# Patient Record
Sex: Male | Born: 1950 | Race: White | Hispanic: No | Marital: Married | Smoking: Never smoker
Health system: Southern US, Community
[De-identification: ages and names within clinical notes are randomized; demographics above are authoritative.]

## PROBLEM LIST (undated history)

## (undated) DIAGNOSIS — I259 Chronic ischemic heart disease, unspecified: Secondary | ICD-10-CM

## (undated) DIAGNOSIS — R0602 Shortness of breath: Secondary | ICD-10-CM

## (undated) DIAGNOSIS — I2510 Atherosclerotic heart disease of native coronary artery without angina pectoris: Secondary | ICD-10-CM

## (undated) DIAGNOSIS — E785 Hyperlipidemia, unspecified: Secondary | ICD-10-CM

## (undated) DIAGNOSIS — I10 Essential (primary) hypertension: Secondary | ICD-10-CM

## (undated) HISTORY — DX: Hyperlipidemia, unspecified: E78.5

## (undated) HISTORY — DX: Chronic ischemic heart disease, unspecified: I25.9

## (undated) HISTORY — PX: EYE SURGERY: SHX253

## (undated) HISTORY — DX: Shortness of breath: R06.02

## (undated) HISTORY — DX: Atherosclerotic heart disease of native coronary artery without angina pectoris: I25.10

---

## 2002-08-03 ENCOUNTER — Ambulatory Visit (HOSPITAL_COMMUNITY): Admission: RE | Admit: 2002-08-03 | Discharge: 2002-08-03 | Payer: Self-pay | Admitting: Cardiovascular Disease

## 2002-08-03 HISTORY — PX: CARDIAC CATHETERIZATION: SHX172

## 2004-12-17 ENCOUNTER — Ambulatory Visit (HOSPITAL_COMMUNITY): Admission: RE | Admit: 2004-12-17 | Discharge: 2004-12-17 | Payer: Self-pay | Admitting: Endocrinology

## 2005-12-08 ENCOUNTER — Emergency Department (HOSPITAL_COMMUNITY): Admission: EM | Admit: 2005-12-08 | Discharge: 2005-12-08 | Payer: Self-pay | Admitting: Emergency Medicine

## 2007-10-10 ENCOUNTER — Encounter: Admission: RE | Admit: 2007-10-10 | Discharge: 2007-10-10 | Payer: Self-pay | Admitting: Orthopedic Surgery

## 2007-10-18 ENCOUNTER — Encounter: Admission: RE | Admit: 2007-10-18 | Discharge: 2008-01-16 | Payer: Self-pay | Admitting: Orthopedic Surgery

## 2009-07-11 DIAGNOSIS — M4310 Spondylolisthesis, site unspecified: Secondary | ICD-10-CM | POA: Insufficient documentation

## 2010-05-12 ENCOUNTER — Ambulatory Visit: Payer: Self-pay | Admitting: Cardiology

## 2010-06-10 ENCOUNTER — Telehealth (INDEPENDENT_AMBULATORY_CARE_PROVIDER_SITE_OTHER): Payer: Self-pay | Admitting: *Deleted

## 2010-06-11 ENCOUNTER — Encounter: Payer: Self-pay | Admitting: Cardiology

## 2010-06-11 ENCOUNTER — Encounter (HOSPITAL_COMMUNITY)
Admission: RE | Admit: 2010-06-11 | Discharge: 2010-08-05 | Payer: Self-pay | Source: Home / Self Care | Attending: Cardiology | Admitting: Cardiology

## 2010-06-11 ENCOUNTER — Encounter: Payer: Self-pay | Admitting: *Deleted

## 2010-06-11 ENCOUNTER — Ambulatory Visit: Payer: Self-pay

## 2010-08-05 NOTE — Progress Notes (Signed)
Summary: Nuclear pre procedure  Phone Note Outgoing Call Call back at [facility] Phone [phone]   Call placed by: Rea College, CMA,  June 10, 2010 4:25 PM Call placed to: Patient Summary of Call: Reviewed information on Myoview Information Sheet (see scanned document for further details).  Spoke with patient.      Nuclear Med Background Indications for Stress Test: Evaluation for Ischemia   History: Heart Catheterization, Myocardial Perfusion Study   Symptoms: Chest Tightness with Exertion, DOE, Fatigue with Exertion    Nuclear Pre-Procedure Cardiac Risk Factors: Hypertension, Lipids

## 2010-08-05 NOTE — Assessment & Plan Note (Signed)
Summary: Cardiology Nuclear Testing  Nuclear Med Background Indications for Stress Test: Evaluation for Ischemia   History: Heart Catheterization, Myocardial Perfusion Study  History Comments: '04 Cath:single vessel occlusion OM1, NL LVF,Tx Med. 8/10 MPS: Abnormal reversible lateral ischemia with known chronic occlusion OM1, unchanged from previous study.  Symptoms: Chest Tightness with Exertion, Dizziness, DOE, Fatigue with Exertion, Palpitations    Nuclear Pre-Procedure Cardiac Risk Factors: Hypertension, Lipids Caffeine/Decaff Intake: None NPO After: 10:00 PM Lungs: clear IV 0.9% NS with Angio Cath: 22g     IV Site: R Hand IV Started by: Irean Hong, RN Chest Size (in) 44     Height (in): 66.5 Weight (lb): 179 BMI: 28.56 Tech Comments: Held toprol 24 hrs.  Nuclear Med Study 1 or 2 day study:  1 day     Stress Test Type:  Stress Reading MD:  Cassell Clement, MD     Referring MD:  Wylene Simmer Resting Radionuclide:  Technetium 83m Tetrofosmin     Resting Radionuclide Dose:  11.0 mCi  Stress Radionuclide:  Technetium 5m Tetrofosmin     Stress Radionuclide Dose:  33.0 mCi   Stress Protocol Exercise Time (min):  10:00 min     Max HR:  142 bpm     Predicted Max HR:  163 bpm  Max Systolic BP: 195 mm Hg     Percent Max HR:  87.12 %     METS: 11.70 Rate Pressure Product:  95188    Stress Test Technologist:  Milana Na, EMT-P     Nuclear Technologist:  Domenic Polite, CNMT  Rest Procedure  Myocardial perfusion imaging was performed at rest 45 minutes following the intravenous administration of Technetium 79m Tetrofosmin.  Stress Procedure  The patient exercised for 10:00. The patient stopped due to fatigue, sob, and chest tightness.  There were + significant ST-T wave changes and occ pacs.  Technetium 47m Tetrofosmin was injected at peak exercise and myocardial perfusion imaging was performed after a brief delay.  QPS Raw Data Images:  Normal; no motion artifact;  normal heart/lung ratio. Stress Images:  There is decreased uptake in the lateral wall. Rest Images:  Normal homogeneous uptake in all areas of the myocardium. Subtraction (SDS):  Reversible lateral wall ischemia. Transient Ischemic Dilatation:  1.09  (Normal <1.22)  Lung/Heart Ratio:  .29  (Normal <0.45)  Quantitative Gated Spect Images QGS EDV:  78 ml QGS ESV:  27 ml QGS EF:  65 % QGS cine images:  No segmental wall motion abnormalities.  Findings Low risk nuclear study Clinically Abnormal (chest pain, ST abnormality, hypotension) Evidence for inferior ischemia      Overall Impression  Exercise Capacity: Good exercise capacity. BP Response: Normal blood pressure response. Clinical Symptoms: Mild chest pain/dyspnea. ECG Impression: Lateral ST depression c/w ischemia. Overall Impression: Low risk stress nuclear study. Overall Impression Comments: Reversible lateral wall ischemia unchanged from prior cardiolite of 02/06/09.  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 Exercise tolerance remains good.  Continue aggressive medical therapy.  Appended Document: Cardiology Nuclear Testing COPY SENT TO DR. Patty Sermons

## 2010-11-21 NOTE — Cardiovascular Report (Signed)
NAME:  Tommy Lopez, Tommy Lopez NO.:  [id number]   MEDICAL RECORD NO.:  [id number]                   PATIENT TYPE:  OIB   LOCATION:  2899                                 FACILITY:  [facility]   PHYSICIAN:  Peter M. Swaziland, M.D.               DATE OF BIRTH:  [birth year]   DATE OF PROCEDURE:  08/03/2002  DATE OF DISCHARGE:                              CARDIAC CATHETERIZATION   INDICATION FOR PROCEDURE:  The patient is a 60 year old male with  longstanding history of insulin-dependent diabetes mellitus and  hyperlipidemia, who has an abnormal stress Cardiolite study.   ACCESS:  Access is via the right femoral artery using standard Seldinger  technique.   EQUIPMENT:  The 6-French 4-cm right and left Judkins catheters, 6-French  pigtail catheter, 6-French arterial sheath.   MEDICATIONS:  Local anesthesia with 1% Xylocaine.   CONTRAST:  140 mL of Omnipaque.   HEMODYNAMIC DATA:  Aortic pressure was 119/65 with a mean of 86.  Left  ventricular pressure was 126 with an EDP of 10 mmHg.   ANGIOGRAPHIC DATA:  The left coronary artery arises in a dominant fashion.  The left main coronary artery is normal.   The left anterior descending artery has scattered irregularities throughout,  up to 20%.  The first diagonal branch is a moderate-sized vessel which has a  60-70% stenosis in the mid-vessel.   The left circumflex coronary artery is a large dominant vessel.  It gives  rise to a single large marginal branch and then the distal vessel gives off  the PDA and posterolateral branches.  The first marginal branch is occluded  proximally and has excellent left-to-left collaterals filling the entire  vessel.  The remainder of the circumflex is without significant disease.   The right coronary artery is a small non-dominant vessel that has only minor  irregularities of less than 10%.   Left ventricular angiography was performed in the RAO and LAO cranial views.  This  demonstrates normal left ventricular size and excellent contractility  with no segmental wall motion abnormalities.  Ejection fraction is estimated  at 70-75%.   FINAL INTERPRETATION:  1. Single-vessel occlusive atherosclerotic coronary artery disease involving     the first marginal branch.  This branch     is well-collateralized.  2. Normal left ventricular function.   PLAN:  Would recommend continue medical therapy at this point.                                               Peter M. Swaziland, M.D.    PMJ/MEDQ  D:  08/03/2002  T:  08/03/2002  Job:  [id number]   cc:   Cassell Clement, M.D.  [address], [address]  [place]  [place] [address]  Fax:  [phone]   Jeannett Senior A. Evlyn Kanner, M.D.  [address]  [place]  [place] [address]  Fax: [phone]

## 2010-11-21 NOTE — H&P (Signed)
NAME:  Tommy Lopez, Tommy Lopez NO.:  [id number]   MEDICAL RECORD NO.:  [id number]                   PATIENT TYPE:   LOCATION:                                       FACILITY:   PHYSICIAN:  Peter M. Swaziland, M.D.               DATE OF BIRTH:  1950/08/13   DATE OF ADMISSION:  07/31/2002  DATE OF DISCHARGE:                                HISTORY & PHYSICAL   HISTORY OF PRESENT ILLNESS:  The patient is a very pleasant 60 year old  white male with longstanding insulin-dependent diabetes mellitus.  Recently  he has been experiencing symptoms of atypical chest pain.  He states he just  notices that his left chest feels different, it sometimes twinges with pain,  this may or may not be related to exertion.  He subsequently was referred  for a stress Cardiolite study which was performed on 07/19/02.  He was able  to walk for 6 minutes and 15 seconds, he became dyspneic, but did not  experience chest discomfort.  His Cardiolite study showed evidence of  inferolateral ischemia.  Left ventricular function was normal with an  ejection fraction of 63%.  Given his abnormal Cardiolite results it has been  recommended he undergo cardiac catheterization.   PAST MEDICAL HISTORY:  1. Insulin-dependent diabetes mellitus since 1966.  2. Hypercholesterolemia.  3. Hypertension.  4. 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  5. History of removal of ganglion cyst from his right wrist.  6. History of tonsillectomy.   ALLERGIES:  No known drug allergies.   CURRENT MEDICATIONS:  1. Humulin L on a sliding scale.  2. Toprol XL 50 mg per day.  3. Diazepam 5 mg p.r.n.  4. Clonidine 0.1 mg daily.  5. Pravachol 80 mg per day.  6. Aspirin daily.   SOCIAL HISTORY:  The patient is disabled.  His wife is also blind and both  he and his wife use guide dogs.  He exercises regularly, walking up to 40  miles a week.  He denies tobacco or alcohol use.   FAMILY HISTORY:  Father  died at age 30.  He required a pacemaker.  His  mother died of unknown causes.  He has no siblings.   REVIEW OF SYSTEMS:  Denies any history of nephropathy or neuropathy.  Denies  claudication.  No history of TIA or stroke.  Other review of systems are  negative.   PHYSICAL EXAMINATION:  GENERAL:  The patient is a pleasant white male in no  apparent distress.  VITAL SIGNS:  Weight 175, blood pressure 152/76, pulse 84 and regular.  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  He is blind in his  right eye.  Oropharynx is clear.  NECK:  He has no JVD, adenopathy, thyromegaly or bruits.  LUNGS:  Clear to auscultation and percussion.  CARDIAC:  Reveals a regular rate  and rhythm, normal S1, S2 without gallops,  murmurs, rubs or clicks.  ABDOMEN:  Soft, nontender.  No hepatosplenomegaly, masses or bruits.  EXTREMITIES:  Femoral and pedal pulses are 2+ and symmetric.  NEUROLOGIC:  Exam is nonfocal.   LABORATORY DATA:  Resting ECG shows normal sinus rhythm with a normal ECG.  Chest x-ray shows no active disease.   IMMPRESSION:  1. Atypical chest pain.  2. Abnormal stress Cardiolite study.  3. Longstanding insulin-dependent diabetes mellitus.  4. Hypercholesterolemia.  5. Hypertension.   PLAN:  The patient will be admitted for cardiac catheterization with further  therapy pending these results.                                                Peter M. Swaziland, M.D.    PMJ/MEDQ  D:  07/24/2002  T:  07/24/2002  Job:  [id number]   cc:   Jeannett Senior A. Evlyn Kanner, M.D.  [address]  [place]  [place] [address]  Fax: [phone]

## 2010-11-25 DIAGNOSIS — N401 Enlarged prostate with lower urinary tract symptoms: Secondary | ICD-10-CM | POA: Insufficient documentation

## 2010-12-02 ENCOUNTER — Ambulatory Visit: Payer: Self-pay | Admitting: Cardiology

## 2010-12-02 ENCOUNTER — Encounter: Payer: Self-pay | Admitting: Cardiology

## 2010-12-03 ENCOUNTER — Encounter: Payer: Self-pay | Admitting: Cardiology

## 2010-12-03 ENCOUNTER — Ambulatory Visit (INDEPENDENT_AMBULATORY_CARE_PROVIDER_SITE_OTHER): Payer: Medicare Other | Admitting: Cardiology

## 2010-12-03 DIAGNOSIS — H543 Unqualified visual loss, both eyes: Secondary | ICD-10-CM | POA: Insufficient documentation

## 2010-12-03 DIAGNOSIS — E7800 Pure hypercholesterolemia, unspecified: Secondary | ICD-10-CM

## 2010-12-03 DIAGNOSIS — I259 Chronic ischemic heart disease, unspecified: Secondary | ICD-10-CM | POA: Insufficient documentation

## 2010-12-03 DIAGNOSIS — E119 Type 2 diabetes mellitus without complications: Secondary | ICD-10-CM | POA: Insufficient documentation

## 2010-12-03 NOTE — Assessment & Plan Note (Signed)
The patient has a history of known ischemic heart disease.  Cardiac catheterization in 2004 showed single vessel occlusive disease involving the first marginal branch.He has not required percutaneous cardiac intervention.  Since last visit he's had no significant chest tightness or angina.  He continues to walk on a regular basis.  He has been troubled with seasonal allergies which has caused some mild dizziness.  He's not having any symptoms of congestive heart failure or unusual dyspnea.  He has not had to take any sublingual nitroglycerin

## 2010-12-03 NOTE — Progress Notes (Signed)
Tommy Lopez Date of Birth:  1950/11/17 [facility] / [facility] [address]   [address] [place], [place]  [address] [phone]           Fax   [phone]  HPI: This pleasant 60 year old gentleman who is blind comes in for a six-month followup office visit.  He has a history of ischemic heart disease, hypercholesterolemia, and insulin-dependent diabetes mellitus.  The patient understands cardiac catheterization in 2004.  He was found to have single vessel occlusive disease involving the first marginal branch.  He did not require percutaneous intervention.  His last nuclear stress test was 06/11/10 which time he exercised for 10 minutes and achieved a satisfactory heart rate and was found to have a low risk nuclear study.  There was evidence of reversible lateral wall ischemia unchanged from prior Cardiolite of 02/06/09 and secondary to his known chronic occlusion of the marginal branch of the circumflex.  No other areas of ischemia were seen.  Continued aggressive medical therapy was recommended.  Current Outpatient Prescriptions  Medication Sig Dispense Refill  . aspirin 325 MG tablet Take 325 mg by mouth daily.        . fish oil-omega-3 fatty acids 1000 MG capsule Take 5,000 mg by mouth daily.       . insulin lispro (HUMALOG) 100 UNIT/ML injection Inject into the skin 3 (three) times daily before meals.        Marland Kitchen loratadine (CLARITIN) 10 MG tablet Take 10 mg by mouth as needed.        . metoprolol succinate (TOPROL-XL) 25 MG 24 hr tablet Take 25 mg by mouth daily.        . Multiple Vitamins-Minerals (CENTRUM SILVER) tablet Take 1 tablet by mouth daily.        . nitroGLYCERIN (NITROSTAT) 0.4 MG SL tablet Place 0.4 mg under the tongue every 5 (five) minutes as needed.        . pravastatin (PRAVACHOL) 40 MG tablet Take 40 mg by mouth daily.        . ramipril (ALTACE) 10 MG tablet Take 10 mg by mouth daily.          No Known Allergies  Patient Active Problem List    Diagnoses  . Ischemic heart disease  . Diabetes mellitus  . Hypercholesterolemia  . Blind in both eyes    History  Smoking status  . Never Smoker   Smokeless tobacco  . Not on file    History  Alcohol Use No    No family history on file.  Review of Systems: The patient denies any heat or cold intolerance.  No weight gain or weight loss.  The patient denies headaches.  There is no cough or sputum production.  The patient denies dizziness.  There is no hematuria or hematochezia.  The patient denies any muscle aches or arthritis.  The patient denies any rash.  The patient denies frequent falling or instability.Review of systems positive for mild dizziness related to seasonal allergy to  There is no history of depression or anxiety.  All other systems were reviewed and are negative.   Physical Exam: Filed Vitals:   12/03/10 0913  BP: 110/56  Pulse: 62  The general appearance reveals a very pleasant blind gentleman in no acute distress.  Head and neck reveal no carotid bruits.  Jugular venous pressure is normal.  Thyroid is not enlarged.  There is no lymphadenopathy.The chest is clear to percussion and auscultation. There are  no rales or rhonchi. Expansion of the chest is symmetrical.The precordium is quiet.  The first heart sound is normal.  The second heart sound is physiologically split.  There is no murmur gallop rub or click.  There is no abnormal lift or heave.The abdomen is soft and nontender. Bowel sounds are normal. The liver and spleen are not enlarged. There Are no abdominal masses. There are no bruits.  The pedal pulses are good.  There is no phlebitis or edema.  There is no cyanosis or clubbing.Strength is normal and symmetrical in all extremities.  There is no lateralizing weakness.  There are no sensory deficits.The skin is warm and dry.  There is no rash.    Assessment / Plan:  Continue present medication.  Continue regular exercise.  Recheck in 6 months

## 2010-12-03 NOTE — Assessment & Plan Note (Signed)
His diabetes And his hypercholesterolemia are followed closely by Dr. Evlyn Kanner.

## 2011-06-16 ENCOUNTER — Ambulatory Visit (INDEPENDENT_AMBULATORY_CARE_PROVIDER_SITE_OTHER): Payer: Medicare Other | Admitting: Cardiology

## 2011-06-16 ENCOUNTER — Encounter: Payer: Self-pay | Admitting: Cardiology

## 2011-06-16 DIAGNOSIS — E7800 Pure hypercholesterolemia, unspecified: Secondary | ICD-10-CM

## 2011-06-16 DIAGNOSIS — R079 Chest pain, unspecified: Secondary | ICD-10-CM

## 2011-06-16 DIAGNOSIS — I259 Chronic ischemic heart disease, unspecified: Secondary | ICD-10-CM

## 2011-06-16 DIAGNOSIS — E119 Type 2 diabetes mellitus without complications: Secondary | ICD-10-CM

## 2011-06-16 NOTE — Assessment & Plan Note (Signed)
The patient is working closely with his endocrinologist Dr. Adria Devon regarding his diabetes.  Since we last saw him 6 months ago his weight is up 13 pounds which he attributes to the fact that his diabetes has not been as well controlled.  Today in the office he was having mild hypoglycemia.

## 2011-06-16 NOTE — Assessment & Plan Note (Signed)
Patient has had increasing exertional dyspnea with decreased exercise tolerance over the past month.  He has also noted chest discomfort coming on more easily and exacerbated by the cold weather or exacerbated by walking after he has eaten a meal.  His EKG today shows normal sinus rhythm and incomplete right bundle branch block.  We will plan to have him return for a Myoview stress test.  This time because of his decreased exercise tolerance he did not think he would be able to walk briskly and so we will use a walking LexiScan protocol.

## 2011-06-16 NOTE — Patient Instructions (Signed)
Your physician recommends that you continue on your current medications as directed. Please refer to the Current Medication list given to you today.  Your physician has requested that you have a lexiscan myoview. For further information please visit [email or web address]. Please follow instruction sheet, as given.  Your physician wants you to follow-up in: 6 months You will receive a reminder letter in the mail two months in advance. If you don't receive a letter, please call our office to schedule the follow-up appointment.   

## 2011-06-16 NOTE — Progress Notes (Signed)
Rudi Coco Date of Birth:  1950-11-10 [facility] Card[facility] / [facility] [address]   [address] [place], [place]  [address] [phone]           Fax   [phone]  History of Present Illness: This pleasant 60 year old gentleman comes in for a six-month followup office visit.  The patient is blind.  The patient has a history of ischemic heart disease hypercholesterolemia and insulin-dependent diabetes mellitus.  Patient underwent cardiac catheterization in 2004 and was found to have a single-vessel occlusion involving the first marginal branch.  He did not require percutaneous intervention.  He has had subsequent stress test most recently 06/11/10 which time he went for 10 minutes and was found to have a low risk nuclear study with evidence of mild reversible lateral wall ischemia unchanged from previous studies.  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  Patient had done well until several weeks ago when he began to notice some increasing shortness of breath.  He has also had increasing chest tightness worse in cold weather and worse if he tries to exercise after he eats.  He is concerned that something is different about his heart  Current Outpatient Prescriptions  Medication Sig Dispense Refill  . aspirin 325 MG tablet Take 325 mg by mouth daily.        . fish oil-omega-3 fatty acids 1000 MG capsule Take 5,000 mg by mouth daily.       . insulin lispro (HUMALOG) 100 UNIT/ML injection Inject into the skin 2 (two) times daily.       Marland Kitchen loratadine (CLARITIN) 10 MG tablet Take 10 mg by mouth as needed.        . metoprolol succinate (TOPROL-XL) 25 MG 24 hr tablet Take 25 mg by mouth daily.        . Multiple Vitamins-Minerals (CENTRUM SILVER) tablet Take 1 tablet by mouth daily.        . nitroGLYCERIN (NITROSTAT) 0.4 MG SL tablet Place 0.4 mg under the tongue every 5 (five) minutes as needed.        . pravastatin (PRAVACHOL) 40 MG  tablet Take 40 mg by mouth daily.        . ramipril (ALTACE) 10 MG tablet Take 10 mg by mouth daily.        . ANDROGEL PUMP 20.25 MG/ACT (1.62%) GEL         No Known Allergies  Patient Active Problem List  Diagnoses  . Ischemic heart disease  . Diabetes mellitus  . Hypercholesterolemia  . Blind in both eyes    History  Smoking status  . Never Smoker   Smokeless tobacco  . Not on file    History  Alcohol Use No    No family history on file.  Review of Systems: Constitutional: no fever chills diaphoresis or fatigue or change in weight.  Head and neck: no hearing loss, no epistaxis, no photophobia or visual disturbance. Respiratory: No cough, shortness of breath or wheezing. Cardiovascular: No chest pain peripheral edema, palpitations. Gastrointestinal: No abdominal distention, no abdominal pain, no change in bowel habits hematochezia or melena. Genitourinary: No dysuria, no frequency, no urgency, no nocturia. Musculoskeletal:No arthralgias, no back pain, no gait disturbance or myalgias. Neurological: No dizziness, no headaches, no numbness, no seizures, no syncope, no weakness, no tremors. Hematologic: No lymphadenopathy, no easy bruising. Psychiatric: No confusion, no hallucinations, no sleep disturbance.    Physical Exam: Ceasar Mons  Vitals:   06/16/11 1116  BP: 130/58  Pulse: 73   the general appearance reveals a well-developed well-nourished gentleman in no acute distress.  The skin is slightly clammy from mild hypoglycemia and the patient is eating some glucose-containing candies.Pupils equal and reactive.   Extraocular Movements are full.  There is no scleral icterus.  The mouth and pharynx are normal.  The neck is supple.  The carotids reveal no bruits.  The jugular venous pressure is normal.  The thyroid is not enlarged.  There is no lymphadenopathy.  The patient is blind The chest is clear to percussion and auscultation. There are no rales or rhonchi. Expansion of the  chest is symmetrical.  The precordium is quiet.  The first heart sound is normal.  The second heart sound is physiologically split.  There is no murmur gallop rub or click.  There is no abnormal lift or heave.  The abdomen is soft and nontender. Bowel sounds are normal. The liver and spleen are not enlarged. There Are no abdominal masses. There are no bruits.  The pedal pulses are good.  There is no phlebitis or edema.  There is no cyanosis or clubbing. Strength is normal and symmetrical in all extremities.  There is no lateralizing weakness.  There are no sensory deficits.  The patient is blind.  EKG shows normal sinus rhythm and incomplete right bundle branch       Assessment / Plan: Continue same medication.  Return soon for a walking LexiScan Myoview.  Recheck for followup office visit 6 months

## 2011-06-29 ENCOUNTER — Other Ambulatory Visit (HOSPITAL_COMMUNITY): Payer: Self-pay | Admitting: Radiology

## 2011-06-29 DIAGNOSIS — R079 Chest pain, unspecified: Secondary | ICD-10-CM

## 2011-07-02 ENCOUNTER — Ambulatory Visit (HOSPITAL_COMMUNITY): Payer: Medicare Other | Attending: Internal Medicine | Admitting: Radiology

## 2011-07-02 ENCOUNTER — Encounter (HOSPITAL_COMMUNITY): Payer: Self-pay | Admitting: Radiology

## 2011-07-02 VITALS — BP 134/65 | Ht 66.0 in | Wt 187.0 lb

## 2011-07-02 DIAGNOSIS — I4510 Unspecified right bundle-branch block: Secondary | ICD-10-CM | POA: Insufficient documentation

## 2011-07-02 DIAGNOSIS — R0602 Shortness of breath: Secondary | ICD-10-CM

## 2011-07-02 DIAGNOSIS — R079 Chest pain, unspecified: Secondary | ICD-10-CM

## 2011-07-02 DIAGNOSIS — R0609 Other forms of dyspnea: Secondary | ICD-10-CM | POA: Insufficient documentation

## 2011-07-02 DIAGNOSIS — R0789 Other chest pain: Secondary | ICD-10-CM | POA: Insufficient documentation

## 2011-07-02 DIAGNOSIS — Z794 Long term (current) use of insulin: Secondary | ICD-10-CM | POA: Insufficient documentation

## 2011-07-02 DIAGNOSIS — R0989 Other specified symptoms and signs involving the circulatory and respiratory systems: Secondary | ICD-10-CM | POA: Insufficient documentation

## 2011-07-02 DIAGNOSIS — R42 Dizziness and giddiness: Secondary | ICD-10-CM | POA: Insufficient documentation

## 2011-07-02 DIAGNOSIS — R5381 Other malaise: Secondary | ICD-10-CM | POA: Insufficient documentation

## 2011-07-02 DIAGNOSIS — E785 Hyperlipidemia, unspecified: Secondary | ICD-10-CM | POA: Insufficient documentation

## 2011-07-02 DIAGNOSIS — R110 Nausea: Secondary | ICD-10-CM | POA: Insufficient documentation

## 2011-07-02 DIAGNOSIS — I10 Essential (primary) hypertension: Secondary | ICD-10-CM | POA: Insufficient documentation

## 2011-07-02 DIAGNOSIS — I2510 Atherosclerotic heart disease of native coronary artery without angina pectoris: Secondary | ICD-10-CM

## 2011-07-02 DIAGNOSIS — E119 Type 2 diabetes mellitus without complications: Secondary | ICD-10-CM | POA: Insufficient documentation

## 2011-07-02 MED ORDER — TECHNETIUM TC 99M TETROFOSMIN IV KIT
33.0000 | PACK | Freq: Once | INTRAVENOUS | Status: AC | PRN
  Administered 2011-07-02: 33 via INTRAVENOUS

## 2011-07-02 MED ORDER — REGADENOSON 0.4 MG/5ML IV SOLN
0.4000 mg | Freq: Once | INTRAVENOUS | Status: AC
  Administered 2011-07-02: 0.4 mg via INTRAVENOUS

## 2011-07-02 MED ORDER — TECHNETIUM TC 99M TETROFOSMIN IV KIT
11.0000 | PACK | Freq: Once | INTRAVENOUS | Status: AC | PRN
  Administered 2011-07-02: 11 via INTRAVENOUS

## 2011-07-02 NOTE — Progress Notes (Signed)
[facility] SITE 3 NUCLEAR MED [address] [place] [address] [phone]  Cardiology Nuclear Med Study  Tommy Lopez is a 60 y.o. male [id number] 25-Feb-1951   Nuclear Med Background Indication for Stress Test:  Evaluation for Ischemia History: 2004  Heart Catheterization EF 70-75%, and 12/11 Myocardial Perfusion Study EF-65%, mild reversible lateral wall ischemia Cardiac Risk Factors: Hypertension, IDDM Type 2, Lipids and Incomplete RBBB  Symptoms:  Chest Pressure with Exertion (last date of chest discomfort 2 days ago), Dizziness, DOE, Fatigue, Fatigue with Exertion, Light-Headedness, Nausea and SOB   Nuclear Pre-Procedure Caffeine/Decaff Intake:  7:00pm NPO After: 7:00pm   Lungs:  Clear IV 0.9% NS with Angio Cath:  20g  IV Site: R Hand  IV Started by:  Cathlyn Parsons, RN  Chest Size (in):  42 Cup Size: n/a  Height: 5\' 6"  (1.676 m)  Weight:  187 lb (84.823 kg)  BMI:  Body mass index is 30.18 kg/(m^2). Tech Comments:  Toprol held x 24 hrs, Blood sugar 149 and no insulin this am    Nuclear Med Study 1 or 2 day study: 1 day  Stress Test Type:  Eugenie Birks  Reading MD: [name], MD  Order Authorizing Provider:  [name]  Resting Radionuclide: Technetium 31m Tetrofosmin  Resting Radionuclide Dose: 11.0 mCi   Stress Radionuclide:  Technetium 76m Tetrofosmin  Stress Radionuclide Dose: 33.0 mCi           Stress Protocol Rest HR: 75 Stress HR: 103  Rest BP: 134/65 Stress BP: 155/56  Exercise Time (min): 2:00 METS: 1.6   Predicted Max HR: 161 bpm % Max HR: 63.98 bpm Rate Pressure Product: 64403   Dose of Adenosine (mg):  n/a Dose of Lexiscan: 0.4 mg  Dose of Atropine (mg): n/a Dose of Dobutamine: n/a mcg/kg/min (at max HR)  Stress Test Technologist: Bonnita Levan, RN  Nuclear Technologist:  Domenic Polite, CNMT     Rest Procedure:  Myocardial perfusion imaging was performed at rest 45 minutes following the intravenous  administration of Technetium 65m Tetrofosmin. Rest ECG: NSR  Stress Procedure:  The patient received IV Lexiscan 0.4 mg over 15-seconds with concurrent low level exercise and then Technetium 15m Tetrofosmin was injected at 30-seconds while the patient continued walking one more minute.  There were no significant changes with Lexiscan.  Quantitative spect images were obtained after a 45-minute delay. Stress ECG: No significant change from baseline ECG  QPS Raw Data Images:  Normal; no motion artifact; normal heart/lung ratio. Stress Images:  Moderately to severely reduced uptake in the lateral wall. Rest Images:  Mildly reduced uptake in the lateral wall. Subtraction (SDS):  These findings are consistent with ischemia. Transient Ischemic Dilatation (Normal <1.22):  1.05 Lung/Heart Ratio (Normal <0.45):  0.36  Quantitative Gated Spect Images QGS EDV:  77 ml QGS ESV:  25 ml QGS cine images:  Possible mild lateral HK. QGS EF: 67%  Impression Exercise Capacity:  Lexiscan with low level exercise. BP Response:  Normal blood pressure response. Clinical Symptoms:  No chest pain. ECG Impression:  No significant ST segment change suggestive of ischemia. Comparison with Prior Nuclear Study: No significant change from previous study  Overall Impression:  Abnormal stress nuclear study. There is a reversible defect in the lateral wall suggestive of moderate ischemia. No change from previous images.   [name],MD 7:08 PM

## 2011-07-03 ENCOUNTER — Telehealth: Payer: Self-pay | Admitting: *Deleted

## 2011-07-03 ENCOUNTER — Telehealth: Payer: Self-pay | Admitting: Cardiology

## 2011-07-03 MED ORDER — ISOSORBIDE MONONITRATE ER 30 MG PO TB24: 30.0000 mg | ORAL_TABLET | Freq: Every day | ORAL | Status: DC

## 2011-07-03 NOTE — Telephone Encounter (Signed)
Notified of stress test results. Will send Rx in for Isosorbide MN 30 mg to [facility] in [place]. Will send to Dr. Evlyn Kanner

## 2011-07-03 NOTE — Telephone Encounter (Signed)
Pt rtn call to anita re results

## 2011-07-03 NOTE — Telephone Encounter (Signed)
Message copied by Lorayne Bender on Fri Jul 03, 2011  3:52 PM ------      Message from: Cassell Clement      Created: Fri Jul 03, 2011 12:05 PM       Please report.  The stress test is unchanged from the previous stress tests.  It still demonstrates the same area of reversible ischemia but nothing new.  Since he is having more symptoms now I would like him to begin isosorbide mononitrate 30 mg 1 each morning.  Continue other medicines as is.  Send a copy to Dr. Evlyn Kanner.

## 2011-07-03 NOTE — Progress Notes (Signed)
lmtcb 07/03/11;aeh 

## 2012-08-25 ENCOUNTER — Ambulatory Visit (INDEPENDENT_AMBULATORY_CARE_PROVIDER_SITE_OTHER): Payer: Medicare Other | Admitting: Cardiology

## 2012-08-25 ENCOUNTER — Encounter: Payer: Self-pay | Admitting: Cardiology

## 2012-08-25 VITALS — BP 134/72 | HR 68 | Ht 67.0 in | Wt 188.0 lb

## 2012-08-25 DIAGNOSIS — I259 Chronic ischemic heart disease, unspecified: Secondary | ICD-10-CM

## 2012-08-25 DIAGNOSIS — E7800 Pure hypercholesterolemia, unspecified: Secondary | ICD-10-CM

## 2012-08-25 MED ORDER — NITROGLYCERIN 0.4 MG SL SUBL: 0.4000 mg | SUBLINGUAL_TABLET | SUBLINGUAL | Status: DC | PRN

## 2012-08-25 NOTE — Assessment & Plan Note (Signed)
The patient has a history of hypercholesterolemia and ischemic heart disease.  He is on pravastatin.  His lipids are followed by Dr. Evlyn Kanner.  He does not appear to be having any myalgias or side effects from the statin therapy

## 2012-08-25 NOTE — Assessment & Plan Note (Signed)
The patient has not been experiencing any recurrent chest pain or angina.  He carries nitroglycerin but has not had to use any recently.  His regular activity has been curtailed because of the snow and ice.

## 2012-08-25 NOTE — Progress Notes (Signed)
Rudi Coco Date of Birth:  [birth year] [facility] [address] [place], [place]  [address] [phone]  Fax   [phone]  HPI: This pleasant 62 year old gentleman comes in for a scheduled followup office visit. The patient is blind. The patient has a history of ischemic heart disease hypercholesterolemia and insulin-dependent diabetes mellitus. Patient underwent cardiac catheterization in 2004 and was found to have a single-vessel occlusion involving the first marginal branch. He did not require percutaneous intervention. He has had subsequent stress test most recently 06/11/10 which time he went for 10 minutes and was found to have a low risk nuclear study with evidence of mild reversible lateral wall ischemia unchanged from previous studies. 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  Since last visit the patient has been doing well.   Current Outpatient Prescriptions  Medication Sig Dispense Refill  . aspirin 81 MG tablet Take 81 mg by mouth 2 (two) times daily.      . fish oil-omega-3 fatty acids 1000 MG capsule Take 5,000 mg by mouth daily.       . insulin lispro (HUMALOG) 100 UNIT/ML injection Inject into the skin 2 (two) times daily.       . insulin NPH (HUMULIN N) 100 UNIT/ML injection Inject into the skin 2 (two) times daily.      Marland Kitchen loratadine (CLARITIN) 10 MG tablet Take 10 mg by mouth as needed.        . metoprolol succinate (TOPROL-XL) 25 MG 24 hr tablet Take 25 mg by mouth daily.        . Multiple Vitamins-Minerals (CENTRUM SILVER) tablet Take 1 tablet by mouth daily.        . nitroGLYCERIN (NITROSTAT) 0.4 MG SL tablet Place 1 tablet (0.4 mg total) under the tongue every 5 (five) minutes as needed.  25 tablet  11  . pravastatin (PRAVACHOL) 40 MG tablet Take 40 mg by mouth daily.        . ramipril (ALTACE) 10 MG tablet Take 10 mg by mouth daily.        . isosorbide mononitrate (IMDUR) 30 MG 24 hr  tablet Take 1 tablet (30 mg total) by mouth daily.  30 tablet  11   No current facility-administered medications for this visit.    No Known Allergies  Patient Active Problem List  Diagnosis  . Ischemic heart disease  . Diabetes mellitus  . Hypercholesterolemia  . Blind in both eyes    History  Smoking status  . Never Smoker   Smokeless tobacco  . Not on file    History  Alcohol Use No    No family history on file.  Review of Systems: The patient denies any heat or cold intolerance.  No weight gain or weight loss.  The patient denies headaches or blurry vision.  There is no cough or sputum production.  The patient denies dizziness.  There is no hematuria or hematochezia.  The patient denies any muscle aches or arthritis.  The patient denies any rash.  The patient denies frequent falling or instability.  There is no history of depression or anxiety.  All other systems were reviewed and are negative.   Physical Exam: Filed Vitals:   08/25/12 1112  BP: 134/72  Pulse: 68   The general appearance reveals a well-developed middle-aged gentleman in no distress.  He is blind. The head and neck exam reveals pupils equal and reactive. There is  no scleral icterus.  The mouth and pharynx are normal.  The neck is supple.  The carotids reveal no bruits.  The jugular venous pressure is normal.  The  thyroid is not enlarged.  There is no lymphadenopathy.  The chest is clear to percussion and auscultation.  There are no rales or rhonchi.  Expansion of the chest is symmetrical.  The precordium is quiet.  The first heart sound is normal.  The second heart sound is physiologically split.  There is no murmur gallop rub or click.  There is no abnormal lift or heave.  The abdomen is soft and nontender.  The bowel sounds are normal.  The liver and spleen are not enlarged.  There are no abdominal masses.  There are no abdominal bruits.  Extremities reveal good pedal pulses.  There is no phlebitis or  edema.  There is no cyanosis or clubbing.  Strength is normal and symmetrical in all extremities.  There is no lateralizing weakness.  There are no sensory deficits.  The skin is warm and dry.  There is no rash.  EKG today shows normal sinus rhythm with incomplete right bundle branch block and no ischemic changes  Assessment / Plan: Continue same medication.  We refilled his sublingual nitroglycerin today.  He will continue on his careful diet and try to reestablish his regular exercise pattern once the weather improves.  Recheck in 6 months for followup office visit.

## 2012-08-25 NOTE — Patient Instructions (Addendum)
Your physician recommends that you continue on your current medications as directed. Please refer to the Current Medication list given to you today.  Your physician wants you to follow-up in: 6 month ov You will receive a reminder letter in the mail two months in advance. If you don't receive a letter, please call our office to schedule the follow-up appointment.  

## 2013-02-22 ENCOUNTER — Encounter: Payer: Self-pay | Admitting: Cardiology

## 2013-02-22 ENCOUNTER — Ambulatory Visit (INDEPENDENT_AMBULATORY_CARE_PROVIDER_SITE_OTHER): Payer: Medicare Other | Admitting: Cardiology

## 2013-02-22 VITALS — BP 148/58 | HR 64 | Ht 66.5 in | Wt 189.0 lb

## 2013-02-22 DIAGNOSIS — R079 Chest pain, unspecified: Secondary | ICD-10-CM

## 2013-02-22 DIAGNOSIS — E7800 Pure hypercholesterolemia, unspecified: Secondary | ICD-10-CM

## 2013-02-22 DIAGNOSIS — I259 Chronic ischemic heart disease, unspecified: Secondary | ICD-10-CM

## 2013-02-22 NOTE — Assessment & Plan Note (Signed)
The patient has not been experiencing any recurrent angina pectoris.  He walks a mile and a half every day in about 35 minutes with no symptoms and this includes walking up and down hills.  Occasionally on a weekend they will walk 6 miles divided into 2 or 3 different walks.  No exertional chest discomfort

## 2013-02-22 NOTE — Progress Notes (Signed)
Tommy Lopez Date of Birth:  29-Feb-1951 [facility] [address] [place], [place]  [address] [phone]  Fax   [phone]  HPI: This pleasant 62 year old gentleman comes in for a scheduled followup office visit. The patient is blind. The patient has a history of ischemic heart disease hypercholesterolemia and insulin-dependent diabetes mellitus. Patient underwent cardiac catheterization in 2004 and was found to have a single-vessel occlusion involving the first marginal branch. He did not require percutaneous intervention. He has had subsequent stress test most recently 07/02/11 which showed ejection fraction of 67% and small area of reversible lateral wall ischemia which was unchanged from prior studies. 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 Since last visit the patient had been doing well until a week or 2 ago when he developed some interscapular pain in the mid back.  He had difficulty lying flat.  He does not recall whether the pain was pleuritic.  He went to see his PCP and was given muscle relaxants and improved.   Current Outpatient Prescriptions  Medication Sig Dispense Refill  . aspirin 81 MG tablet Take 81 mg by mouth 2 (two) times daily.      . fish oil-omega-3 fatty acids 1000 MG capsule Take 5,000 mg by mouth daily.       . insulin lispro (HUMALOG) 100 UNIT/ML injection Inject into the skin 2 (two) times daily.       . insulin NPH (HUMULIN N) 100 UNIT/ML injection Inject into the skin 2 (two) times daily.      Marland Kitchen loratadine (CLARITIN) 10 MG tablet Take 10 mg by mouth as needed.        . metoprolol succinate (TOPROL-XL) 25 MG 24 hr tablet Take 25 mg by mouth daily.        . Multiple Vitamins-Minerals (CENTRUM SILVER) tablet Take 1 tablet by mouth daily.        . nitroGLYCERIN (NITROSTAT) 0.4 MG SL tablet Place 1 tablet (0.4 mg total) under the tongue every 5 (five) minutes as needed.  25 tablet  11   . pravastatin (PRAVACHOL) 40 MG tablet Take 40 mg by mouth daily.        . ramipril (ALTACE) 10 MG tablet Take 10 mg by mouth daily.        Marland Kitchen zolpidem (AMBIEN) 5 MG tablet Take 5 mg by mouth at bedtime as needed for sleep.      . isosorbide mononitrate (IMDUR) 30 MG 24 hr tablet Take 1 tablet (30 mg total) by mouth daily.  30 tablet  11   No current facility-administered medications for this visit.    No Known Allergies  Patient Active Problem List   Diagnosis Date Noted  . Chest pain at rest 02/22/2013  . Ischemic heart disease 12/03/2010  . Diabetes mellitus 12/03/2010  . Hypercholesterolemia 12/03/2010  . Blind in both eyes 12/03/2010    History  Smoking status  . Never Smoker   Smokeless tobacco  . Not on file    History  Alcohol Use No    No family history on file.  Review of Systems: The patient denies any heat or cold intolerance.  No weight gain or weight loss.  The patient denies headaches or blurry vision.  There is no cough or sputum production.  The patient denies dizziness.  There is no hematuria or hematochezia.  The patient denies any muscle aches or arthritis.  The patient denies any  rash.  The patient denies frequent falling or instability.  There is no history of depression or anxiety.  All other systems were reviewed and are negative.   Physical Exam: Filed Vitals:   02/22/13 0959  BP: 148/58  Pulse: 64   the general appearance reveals a very pleasant middle-aged gentleman in no distress.  He is blind.  He has his assist dog max with him today.The head and neck exam reveals pupils equal and reactive.  Extraocular movements are full.  There is no scleral icterus.  The mouth and pharynx are normal.  The neck is supple.  The carotids reveal no bruits.  The jugular venous pressure is normal.  The  thyroid is not enlarged.  There is no lymphadenopathy.  The chest is clear to percussion and auscultation.  There are no rales or rhonchi.  Expansion of the chest  is symmetrical.  The precordium is quiet.  The first heart sound is normal.  The second heart sound is physiologically split.  There is no murmur gallop rub or click.  There is no abnormal lift or heave.  The abdomen is soft and nontender.  The bowel sounds are normal.  The liver and spleen are not enlarged.  There are no abdominal masses.  There are no abdominal bruits.  Extremities reveal good pedal pulses.  There is no phlebitis or edema.  There is no cyanosis or clubbing.  Strength is normal and symmetrical in all extremities.  There is no lateralizing weakness.  There are no sensory deficits.  The skin is warm and dry.  There is no rash.  EKG shows normal sinus rhythm and incomplete right bundle branch block and no ischemic changes and no evidence of pericarditis.    Assessment / Plan: Continue same medication.  Recheck in 6 months for followup office visit.

## 2013-02-22 NOTE — Assessment & Plan Note (Signed)
The patient has a history of hypercholesterolemia.  He is on pravastatin 40 mg daily.  Is not having any myalgias.  His blood work is followed by Dr. Evlyn Kanner his PCP

## 2013-02-22 NOTE — Patient Instructions (Addendum)
Your physician recommends that you continue on your current medications as directed. Please refer to the Current Medication list given to you today.  Your physician wants you to follow-up in: 6 month. You will receive a reminder letter in the mail two months in advance. If you don't receive a letter, please call our office to schedule the follow-up appointment.  

## 2013-04-03 ENCOUNTER — Ambulatory Visit (INDEPENDENT_AMBULATORY_CARE_PROVIDER_SITE_OTHER): Payer: Medicare Other | Admitting: Cardiology

## 2013-04-03 ENCOUNTER — Telehealth: Payer: Self-pay | Admitting: Cardiology

## 2013-04-03 ENCOUNTER — Encounter: Payer: Self-pay | Admitting: Cardiology

## 2013-04-03 VITALS — BP 150/70 | HR 73 | Ht 66.5 in | Wt 183.0 lb

## 2013-04-03 DIAGNOSIS — R079 Chest pain, unspecified: Secondary | ICD-10-CM

## 2013-04-03 DIAGNOSIS — R42 Dizziness and giddiness: Secondary | ICD-10-CM | POA: Insufficient documentation

## 2013-04-03 DIAGNOSIS — I259 Chronic ischemic heart disease, unspecified: Secondary | ICD-10-CM

## 2013-04-03 MED ORDER — MECLIZINE HCL 25 MG PO TABS: 25.0000 mg | ORAL_TABLET | Freq: Three times a day (TID) | ORAL | Status: DC | PRN

## 2013-04-03 NOTE — Assessment & Plan Note (Signed)
The patient has been experiencing significant dizziness.  The dizziness is worse if he moves his head or if he bends down and then stands up or if he turns quickly to the side.  His blood pressure today is normal.   He has not had any significant nausea.  His symptoms are suggestive of possible vertigo from the inner ear problems and we will give him a trial of meclizine 25 mg 3 times a day as necessary.  I did warn him that the meclizine might make him feel fatigued.

## 2013-04-03 NOTE — Assessment & Plan Note (Signed)
Patient has long-standing diabetes mellitus.  He has not been having any severe hypoglycemic episodes.

## 2013-04-03 NOTE — Telephone Encounter (Signed)
Spoke with patient and scheduled ov for him to be seen today

## 2013-04-03 NOTE — Telephone Encounter (Signed)
New Problem  Pt states that he is having frequent chest pains high BP, dizziness and his face flushed. We in scheduling cannot schedule same day appt with authorization please assist.

## 2013-04-03 NOTE — Patient Instructions (Signed)
Your physician has requested that you have a lexiscan myoview. For further information please visit [email or web address] Please follow instruction sheet, as given.  START MECLIZINE 25 MG THREE TIMES A DAY AS NEEDED, RX SENT TO [facility]

## 2013-04-03 NOTE — Assessment & Plan Note (Signed)
The patient has had some atypical left-sided chest discomfort.  He notes that the discomfort usually starts about an hour after he finishes exercise and not during exercise.  Yesterday he was able to walk a mile with no chest pain.  He is concerned about his heart and he took some chewable aspirins yesterday for the pain and it seemed to help.  He has nitroglycerin on hand but has not tried it.  Because of his known coronary disease and his recent left-sided chest pain we will have him return soon for a Lexus scan Myoview stress test.  Previous stress tests have been abnormal but showed his known area of lateral ischemia.

## 2013-04-03 NOTE — Progress Notes (Signed)
Tommy Lopez Date of Birth:  [birth year] [facility] [address] [place], [place]  [address] [phone]  Fax   [phone]  HPI: This pleasant 62 year old gentleman comes in for a work in office visit.  He comes in because of a feeling of dizziness and facial flushing which began sometime in August he thinks.  There has been no recent change of medication other than for stopping some testosterone cream which he had taken for the previous year.The patient is blind. The patient has a history of ischemic heart disease hypercholesterolemia and insulin-dependent diabetes mellitus. Patient underwent cardiac catheterization in 2004 and was found to have a single-vessel occlusion involving the first marginal branch. He did not require percutaneous intervention. He has had subsequent stress test most recently 07/02/11 which showed ejection fraction of 67% and small area of reversible lateral wall ischemia which was unchanged from prior studies. 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      Current Outpatient Prescriptions  Medication Sig Dispense Refill  . aspirin 81 MG tablet Take 81 mg by mouth 2 (two) times daily.      Marland Kitchen dexlansoprazole (DEXILANT) 60 MG capsule Take 60 mg by mouth daily.      . fish oil-omega-3 fatty acids 1000 MG capsule Take 5,000 mg by mouth daily.       . insulin lispro (HUMALOG) 100 UNIT/ML injection Inject into the skin 2 (two) times daily.       . insulin NPH (HUMULIN N) 100 UNIT/ML injection Inject into the skin 2 (two) times daily.      Marland Kitchen loratadine (CLARITIN) 10 MG tablet Take 10 mg by mouth as needed.        . metoprolol succinate (TOPROL-XL) 25 MG 24 hr tablet Take 50 mg by mouth daily.       . Multiple Vitamins-Minerals (CENTRUM SILVER) tablet Take 1 tablet by mouth daily.        . nitroGLYCERIN (NITROSTAT) 0.4 MG SL tablet Place 1 tablet (0.4 mg total) under the tongue every 5 (five)  minutes as needed.  25 tablet  11  . pravastatin (PRAVACHOL) 40 MG tablet Take 40 mg by mouth daily.        . ramipril (ALTACE) 10 MG tablet Take 10 mg by mouth daily.        Marland Kitchen zolpidem (AMBIEN) 5 MG tablet Take 5 mg by mouth at bedtime as needed for sleep.      . isosorbide mononitrate (IMDUR) 30 MG 24 hr tablet Take 1 tablet (30 mg total) by mouth daily.  30 tablet  11  . meclizine (ANTIVERT) 25 MG tablet Take 1 tablet (25 mg total) by mouth 3 (three) times daily as needed for dizziness.  30 tablet  0   No current facility-administered medications for this visit.    No Known Allergies  Patient Active Problem List   Diagnosis Date Noted  . Vertigo 04/03/2013  . Chest pain at rest 02/22/2013  . Ischemic heart disease 12/03/2010  . Diabetes mellitus 12/03/2010  . Hypercholesterolemia 12/03/2010  . Blind in both eyes 12/03/2010    History  Smoking status  . Never Smoker   Smokeless tobacco  . Not on file    History  Alcohol Use No    No family history on file.  Review of Systems: The patient denies any heat or cold intolerance.  No weight gain or weight loss.  The patient denies headaches or blurry vision.  There is no cough or sputum production.  The patient denies dizziness.  There is no hematuria or hematochezia.  The patient denies any muscle aches or arthritis.  The patient denies any rash.  The patient denies frequent falling or instability.  There is no history of depression or anxiety.  All other systems were reviewed and are negative.   Physical Exam: Filed Vitals:   04/03/13 1509  BP: 150/70  Pulse: 73   the general appearance reveals a very pleasant middle-aged gentleman in no distress.  He is blind.  He has his assist dog max with him today.The head and neck exam reveals pupils equal and reactive.  Extraocular movements are full.  There is no scleral icterus.  The mouth and pharynx are normal.  The neck is supple.  The carotids reveal no bruits.  The jugular  venous pressure is normal.  The  thyroid is not enlarged.  There is no lymphadenopathy.  The chest is clear to percussion and auscultation.  There are no rales or rhonchi.  Expansion of the chest is symmetrical.  The precordium is quiet.  The first heart sound is normal.  The second heart sound is physiologically split.  There is no murmur gallop rub or click.  There is no abnormal lift or heave.  The abdomen is soft and nontender.  The bowel sounds are normal.  The liver and spleen are not enlarged.  There are no abdominal masses.  There are no abdominal bruits.  Extremities reveal good pedal pulses.  There is no phlebitis or edema.  There is no cyanosis or clubbing.  Strength is normal and symmetrical in all extremities.  There is no lateralizing weakness.  There are no sensory deficits.  The skin is warm and dry.  There is no rash.  EKG today shows normal sinus rhythm and incomplete right bundle branch block and is unchanged since 02/22/13   Assessment / Plan: 1. left lateral chest pain occurring after exercise rather than during exercise.  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  We will evaluate his left chest pain with a Lexa scan Myoview.  He is unable to walk sufficiently rapidly on the treadmill because of low back pain.  2. dizziness suggestive of positional vertigo.  We will give him a trial of meclizine.  If symptoms fail to resolve we want him to follow up with his PCP.  Recheck at his regular visit or sooner when necessary

## 2013-04-04 ENCOUNTER — Encounter: Payer: Self-pay | Admitting: Cardiology

## 2013-04-05 ENCOUNTER — Ambulatory Visit (HOSPITAL_COMMUNITY): Payer: Medicare Other | Attending: Cardiology | Admitting: Radiology

## 2013-04-05 VITALS — BP 135/58 | HR 63 | Ht 66.0 in | Wt 183.0 lb

## 2013-04-05 DIAGNOSIS — E785 Hyperlipidemia, unspecified: Secondary | ICD-10-CM | POA: Insufficient documentation

## 2013-04-05 DIAGNOSIS — I4510 Unspecified right bundle-branch block: Secondary | ICD-10-CM | POA: Insufficient documentation

## 2013-04-05 DIAGNOSIS — R079 Chest pain, unspecified: Secondary | ICD-10-CM

## 2013-04-05 DIAGNOSIS — Z794 Long term (current) use of insulin: Secondary | ICD-10-CM | POA: Insufficient documentation

## 2013-04-05 DIAGNOSIS — I2510 Atherosclerotic heart disease of native coronary artery without angina pectoris: Secondary | ICD-10-CM

## 2013-04-05 DIAGNOSIS — E119 Type 2 diabetes mellitus without complications: Secondary | ICD-10-CM | POA: Insufficient documentation

## 2013-04-05 DIAGNOSIS — R0789 Other chest pain: Secondary | ICD-10-CM | POA: Insufficient documentation

## 2013-04-05 DIAGNOSIS — R5381 Other malaise: Secondary | ICD-10-CM | POA: Insufficient documentation

## 2013-04-05 DIAGNOSIS — R42 Dizziness and giddiness: Secondary | ICD-10-CM | POA: Insufficient documentation

## 2013-04-05 MED ORDER — TECHNETIUM TC 99M SESTAMIBI GENERIC - CARDIOLITE
10.0000 | Freq: Once | INTRAVENOUS | Status: AC | PRN
  Administered 2013-04-05: 10 via INTRAVENOUS

## 2013-04-05 MED ORDER — REGADENOSON 0.4 MG/5ML IV SOLN
0.4000 mg | Freq: Once | INTRAVENOUS | Status: AC
  Administered 2013-04-05: 0.4 mg via INTRAVENOUS

## 2013-04-05 MED ORDER — TECHNETIUM TC 99M SESTAMIBI GENERIC - CARDIOLITE
30.0000 | Freq: Once | INTRAVENOUS | Status: AC | PRN
  Administered 2013-04-05: 30 via INTRAVENOUS

## 2013-04-05 NOTE — Progress Notes (Signed)
[facility] SITE 3 NUCLEAR MED [address] [place], [place] [address] [phone]    Cardiology Nuclear Med Study  Tommy Lopez is a 62 y.o. male     MRN : [id number]     DOB: Oct 14, 1950  Procedure Date: 04/05/2013  Nuclear Med Background Indication for Stress Test:  Evaluation for Ischemia History:  '04 Cath:n/o CAD, EF=75%; '12 GMW:NUUVOZD wall ischemia, EF=67% Cardiac Risk Factors: IDDM Type 2, Lipids and IRBBB  Symptoms:  Chest Pressure and Tightness with Exertion (last episode of chest discomfort was yesterday), Dizziness and Fatigue   Nuclear Pre-Procedure Caffeine/Decaff Intake:  None NPO After: 7:00pm   Lungs:  Clear. O2 Sat: 98% on room air. IV 0.9% NS with Angio Cath:  22g  IV Site: L Hand  IV Started by:  Bonnita Levan, RN  Chest Size (in):  46 Cup Size: n/a  Height: 5\' 6"  (1.676 m)  Weight:  183 lb (83.008 kg)  BMI:  Body mass index is 29.55 kg/(m^2). Tech Comments:  BS =61 @ 6 am, Patient took 1/2 Glucose tablet    Nuclear Med Study 1 or 2 day study: 1 day  Stress Test Type:  Lexiscan  Reading MD: Charlton Haws, MD  Order Authorizing Provider:  Cassell Clement, MD  Resting Radionuclide: Technetium 77m Sestamibi  Resting Radionuclide Dose: 11.0 mCi   Stress Radionuclide:  Technetium 62m Sestamibi  Stress Radionuclide Dose: 33.0 mCi           Stress Protocol Rest HR: 63 Stress HR: 99  Rest BP: 135/58 Stress BP: 157/61  Exercise Time (min): n/a METS: n/a   Predicted Max HR: 160 bpm % Max HR: 61.88 bpm Rate Pressure Product: 66440   Dose of Adenosine (mg):  n/a Dose of Lexiscan: 0.4 mg  Dose of Atropine (mg): n/a Dose of Dobutamine: n/a mcg/kg/min (at max HR)  Stress Test Technologist: Smiley Houseman, CMA-N  Nuclear Technologist:  Domenic Polite, CNMT     Rest Procedure:  Myocardial perfusion imaging was performed at rest 45 minutes following the intravenous administration of Technetium 38m Sestamibi.  Rest ECG: NSR-RBBB and NSR  ICRBBB  Stress Procedure:  The patient received IV Lexiscan 0.4 mg over 15-seconds.  She c/o some chest, arm and neck discomfort with Lexiscan.  Technetium 36m Sestamibi injected at 30-seconds.  Quantitative spect images were obtained after a 45 minute delay.  Stress ECG: No significant change from baseline ECG  QPS Raw Data Images:  Normal; no motion artifact; normal heart/lung ratio. Stress Images:  Decreased lateral wall uptake Rest Images:  Normal homogeneous uptake in all areas of the myocardium. Subtraction (SDS):  These findings are consistent with ischemia. Transient Ischemic Dilatation (Normal <1.22):  n/a Lung/Heart Ratio (Normal <0.45):  0.35  Quantitative Gated Spect Images QGS EDV:  83 ml QGS ESV:  35 ml  Impression Exercise Capacity:  Lexiscan with no exercise. BP Response:  Normal blood pressure response. Clinical Symptoms:  Atypical chest pain. ECG Impression:  No significant ST segment change suggestive of ischemia. Comparison with Prior Nuclear Study: No images to compare  Overall Impression:  Intermediate risk stress nuclear study Moderate lateral wall ischemia at mid and basal level.  LV Ejection Fraction: 58%.  LV Wall Motion:  NL LV Function; NL Wall Motion   Charlton Haws

## 2013-04-06 ENCOUNTER — Telehealth: Payer: Self-pay | Admitting: *Deleted

## 2013-04-06 NOTE — Telephone Encounter (Signed)
Message copied by Burnell Blanks on Thu Apr 06, 2013  4:21 PM ------      Message from: Cassell Clement      Created: Thu Apr 06, 2013 12:59 PM       I gave the patient the report of his nuclear stress test.  He shows moderate lateral wall ischemia.  I looked back at the previous images from 2012 and there is no significant change in his pattern of perfusion since then.  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  We will continue medical therapy.  Repeat cardiac catheterization is not indicated at this time unless his symptoms of chest discomfort worsen.      Please send a copy of the nuclear stress test results and this note to Dr. Evlyn Kanner. ------

## 2013-04-11 ENCOUNTER — Other Ambulatory Visit: Payer: Self-pay | Admitting: Endocrinology

## 2013-04-11 ENCOUNTER — Ambulatory Visit: Payer: Self-pay

## 2013-04-11 DIAGNOSIS — IMO0002 Reserved for concepts with insufficient information to code with codable children: Secondary | ICD-10-CM

## 2013-04-12 ENCOUNTER — Other Ambulatory Visit: Payer: Self-pay

## 2013-04-12 ENCOUNTER — Ambulatory Visit: Payer: Medicare Other

## 2013-04-12 DIAGNOSIS — IMO0002 Reserved for concepts with insufficient information to code with codable children: Secondary | ICD-10-CM

## 2013-04-20 ENCOUNTER — Other Ambulatory Visit: Payer: Self-pay | Admitting: Endocrinology

## 2013-04-20 DIAGNOSIS — R599 Enlarged lymph nodes, unspecified: Secondary | ICD-10-CM

## 2013-04-21 ENCOUNTER — Other Ambulatory Visit: Payer: Medicare Other

## 2013-10-04 ENCOUNTER — Encounter: Payer: Self-pay | Admitting: Cardiology

## 2013-11-20 ENCOUNTER — Ambulatory Visit: Payer: Medicare Other | Admitting: Physician Assistant

## 2013-12-04 ENCOUNTER — Encounter: Payer: Self-pay | Admitting: Physician Assistant

## 2013-12-04 ENCOUNTER — Ambulatory Visit (INDEPENDENT_AMBULATORY_CARE_PROVIDER_SITE_OTHER): Payer: Medicare Other | Admitting: Physician Assistant

## 2013-12-04 VITALS — BP 120/50 | HR 67 | Ht 66.0 in | Wt 181.1 lb

## 2013-12-04 DIAGNOSIS — E7800 Pure hypercholesterolemia, unspecified: Secondary | ICD-10-CM

## 2013-12-04 DIAGNOSIS — I259 Chronic ischemic heart disease, unspecified: Secondary | ICD-10-CM

## 2013-12-04 DIAGNOSIS — H543 Unqualified visual loss, both eyes: Secondary | ICD-10-CM

## 2013-12-04 MED ORDER — NITROGLYCERIN 0.4 MG SL SUBL: 0.4000 mg | SUBLINGUAL_TABLET | SUBLINGUAL | Status: DC | PRN

## 2013-12-04 NOTE — Patient Instructions (Signed)
Your physician wants you to follow-up in: 6 MONTHS WITH DR. BRACKBILL You will receive a reminder letter in the mail two months in advance. If you don't receive a letter, please call our office to schedule the follow-up appointment.   Your physician recommends that you continue on your current medications as directed. Please refer to the Current Medication list given to you today.  

## 2013-12-04 NOTE — Assessment & Plan Note (Signed)
Patient has a known occlusion of his OM. His last Myoview scan in October 2014 showed lateral wall ischemia which is in this distribution. He has had no further chest pain. Continue medical therapy.

## 2013-12-04 NOTE — Progress Notes (Signed)
HPI:  This is a 63 year old male patient of Dr. Mare Ferrari who has a history of coronary artery disease with cath in 2004 showing occlusion of OM 1 treated medically. Most recent stress test in 04/06/13 the EF was 58% with moderate lateral wall ischemia at the mid and basal level. 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 He also has insulin-dependent diabetes mellitus and hypercholesterolemia.  Patient is here for a six-month followup. He is walking 1-1/2-5 miles several days a week without difficulty. He has had no further chest pain. He's concerned because his diastolic blood pressures in the 50s but has been in the 50s for a long time. He has occasional dizziness that has been attributed to vertigo. He sees a physical therapist for this which does help some. He gets dizzy if he bends over quickly or walks down a flight of stairs but this only happens on occasion. He had M. door on his list of medications but says he's not taking this. We verified this with the pharmacist.    No Known Allergies  Current Outpatient Prescriptions on File Prior to Visit: aspirin 81 MG tablet, Take 81 mg by mouth 2 (two) times daily., Disp: , Rfl:  dexlansoprazole (DEXILANT) 60 MG capsule, Take 60 mg by mouth daily., Disp: , Rfl:  fish oil-omega-3 fatty acids 1000 MG capsule, Take 5,000 mg by mouth daily. , Disp: , Rfl:  insulin lispro (HUMALOG) 100 UNIT/ML injection, Inject into the skin 2 (two) times daily. , Disp: , Rfl:  insulin NPH (HUMULIN N) 100 UNIT/ML injection, Inject into the skin 2 (two) times daily., Disp: , Rfl:  isosorbide mononitrate (IMDUR) 30 MG 24 hr tablet, Take 1 tablet (30 mg total) by mouth daily., Disp: 30 tablet, Rfl: 11 loratadine (CLARITIN) 10 MG tablet, Take 10 mg by mouth as needed.  , Disp: , Rfl:  meclizine (ANTIVERT) 25 MG tablet, Take 1 tablet (25 mg total) by mouth 3 (three) times daily as needed for dizziness., Disp: 30 tablet,  Rfl: 0 metoprolol succinate (TOPROL-XL) 25 MG 24 hr tablet, Take 50 mg by mouth daily. , Disp: , Rfl:  Multiple Vitamins-Minerals (CENTRUM SILVER) tablet, Take 1 tablet by mouth daily.  , Disp: , Rfl:  nitroGLYCERIN (NITROSTAT) 0.4 MG SL tablet, Place 1 tablet (0.4 mg total) under the tongue every 5 (five) minutes as needed., Disp: 25 tablet, Rfl: 11 pravastatin (PRAVACHOL) 40 MG tablet, Take 40 mg by mouth daily.  , Disp: , Rfl:  ramipril (ALTACE) 10 MG tablet, Take 10 mg by mouth daily.  , Disp: , Rfl:  zolpidem (AMBIEN) 5 MG tablet, Take 5 mg by mouth at bedtime as needed for sleep., Disp: , Rfl:   No current facility-administered medications on file prior to visit.   Past Medical History:   IHD (ischemic heart disease)                                 Atherosclerotic coronary vascular disease                      Comment:SINGLE VESSEL OCCULSIVE   SOB (shortness of breath) on exertion                        Diabetes mellitus  Hyperlipidemia                                              Past Surgical History:   CARDIAC CATHETERIZATION                          08/03/2002      Comment:NORMAL LEFT VENTRICULAR SIZE AND EXCELLENT               CONTRACTILITY WITH NO SEGMENTAL WALL               ABNORMALITIES. EF 70-75%  No family history on file.   Social History   Marital Status: Married             Spouse Name:                      Years of Education:                 Number of children:             Occupational History   None on file  Social History Main Topics   Smoking Status: Never Smoker                     Smokeless Status: Not on file                      Alcohol Use: No             Drug Use: No             Sexual Activity:                    Other Topics            Concern   None on file  Social History Narrative   None on file    ROS:See history of present illness otherwise negative   PHYSICAL EXAM: Well-nournished, in  no acute distress. Neck: No JVD, HJR, Bruit, or thyroid enlargement  Lungs: No tachypnea, clear without wheezing, rales, or rhonchi  Cardiovascular: RRR, PMI not displaced, heart sounds normal, no murmurs, gallops, bruit, thrill, or heave.  Abdomen: BS normal. Soft without organomegaly, masses, lesions or tenderness.  Extremities: without cyanosis, clubbing or edema. Good distal pulses bilateral  SKin: Warm, no lesions or rashes   Musculoskeletal: No deformities  Neuro: no focal signs  BP 120/50  Pulse 67  Ht 5\' 6"  (1.676 m)  Wt 181 lb 1.9 oz (82.155 kg)  BMI 29.25 kg/m2     EKG: Normal sinus rhythm with incomplete right bundle branch block inferior Q waves no acute change

## 2013-12-04 NOTE — Assessment & Plan Note (Signed)
Patient had blood work 2 weeks ago by Dr. Forde Dandy who manages this.

## 2013-12-04 NOTE — Assessment & Plan Note (Signed)
Managed by Dr. Forde Dandy

## 2013-12-04 NOTE — Assessment & Plan Note (Signed)
Patient here with his guide dog

## 2014-06-22 IMAGING — US US MISC SOFT TISSUE
1 series · 9 of 9 positions shown · non-contrast
Comparison: None.

CLINICAL DATA: Palpable left supraclavicular mass

EXAM:
SOFT TISSUE ULTRASOUND - MISCELLANEOUS
TECHNIQUE: Gray scale ultrasound of the left cervical neck region performed

[Series 1: us misc soft tissue · 0.08mm/px · 9 of 9 slices shown]
[im 1/9]
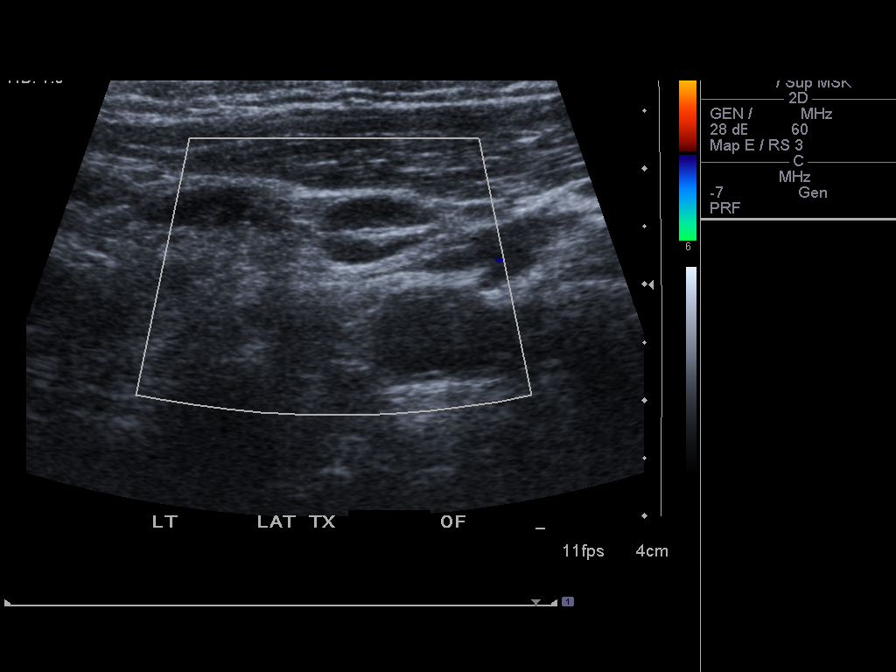
[im 2/9]
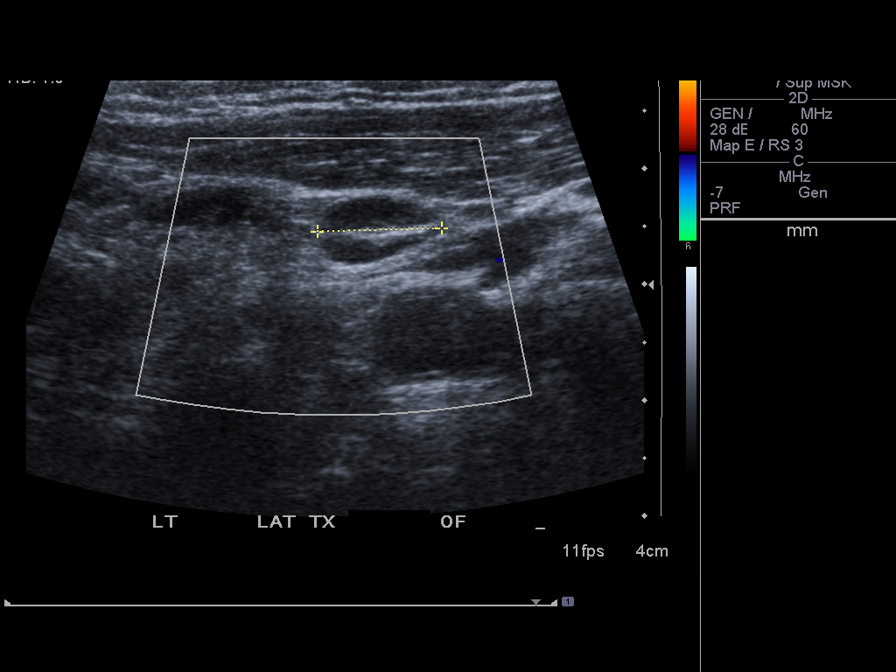
[im 3/9]
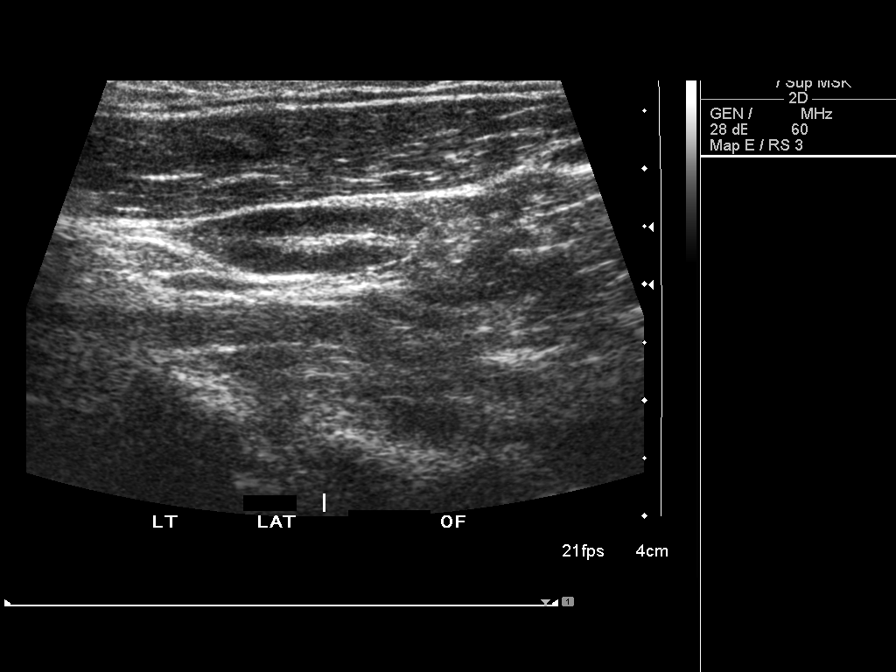
[im 4/9]
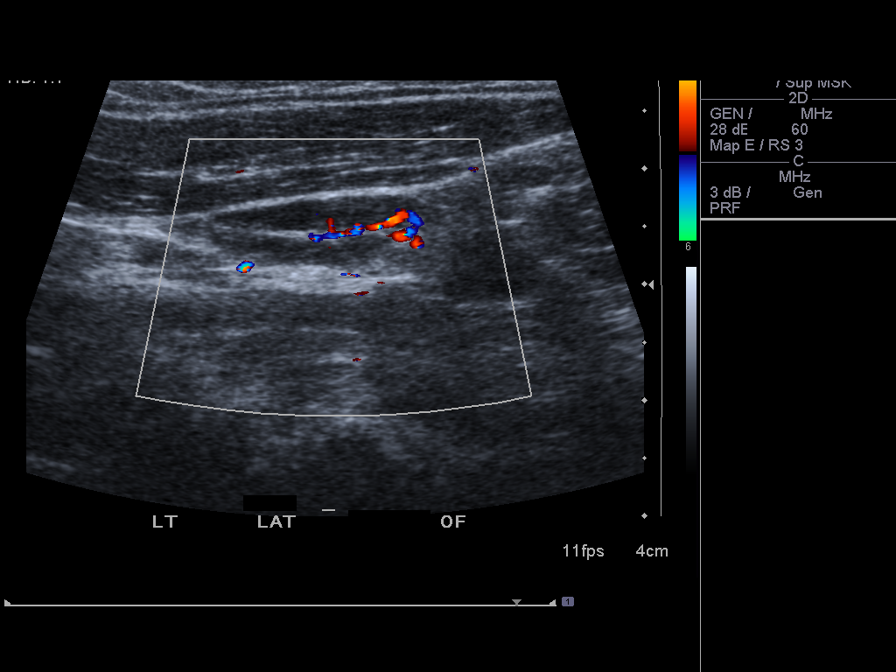
[im 5/9]
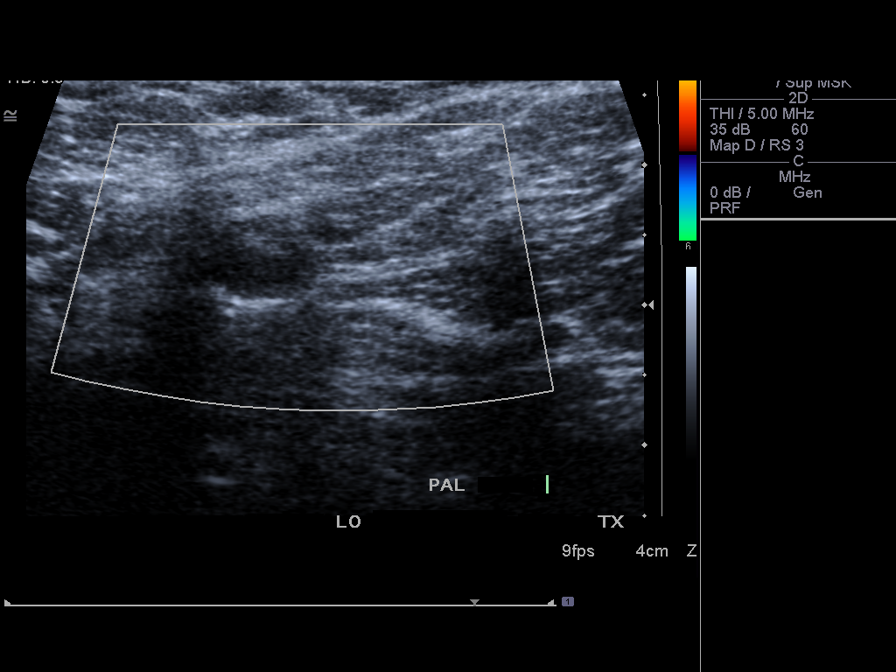
[im 6/9]
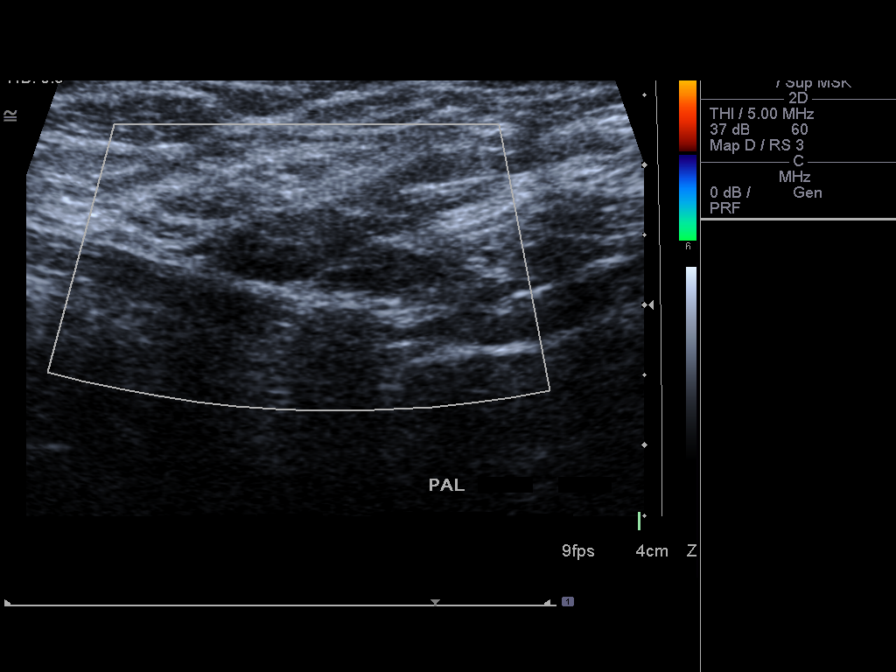
[im 7/9]
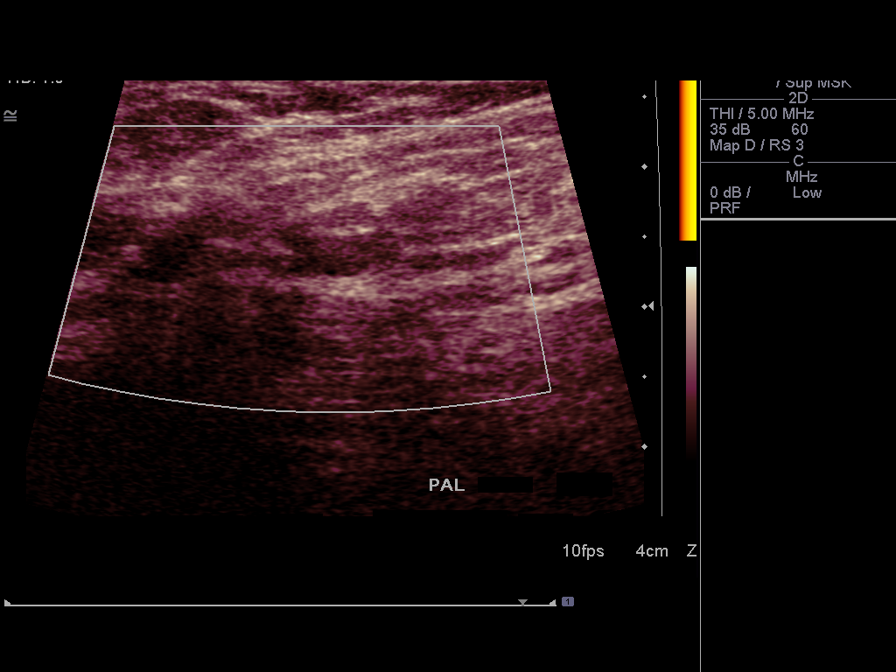
[im 8/9]
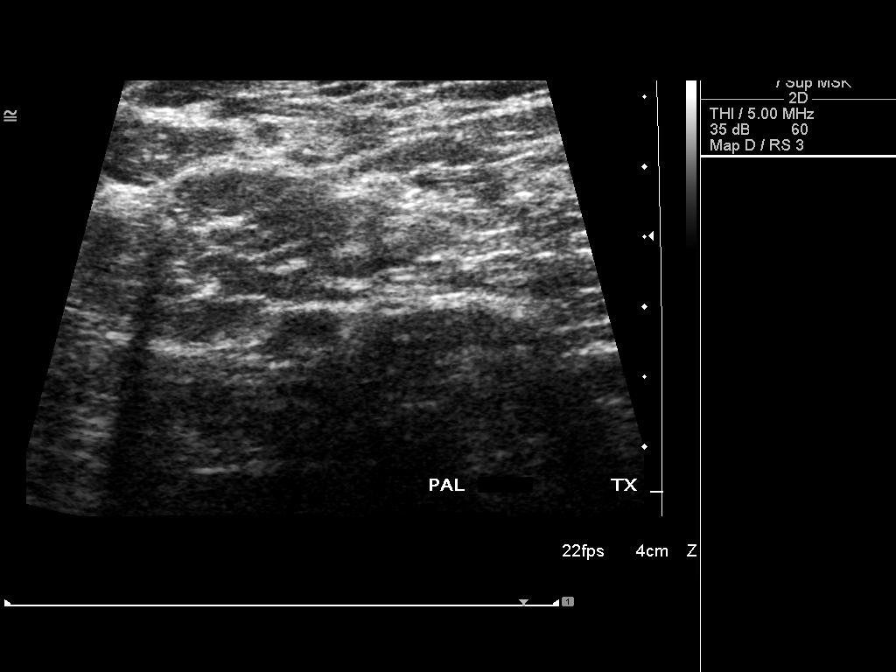
[im 9/9]
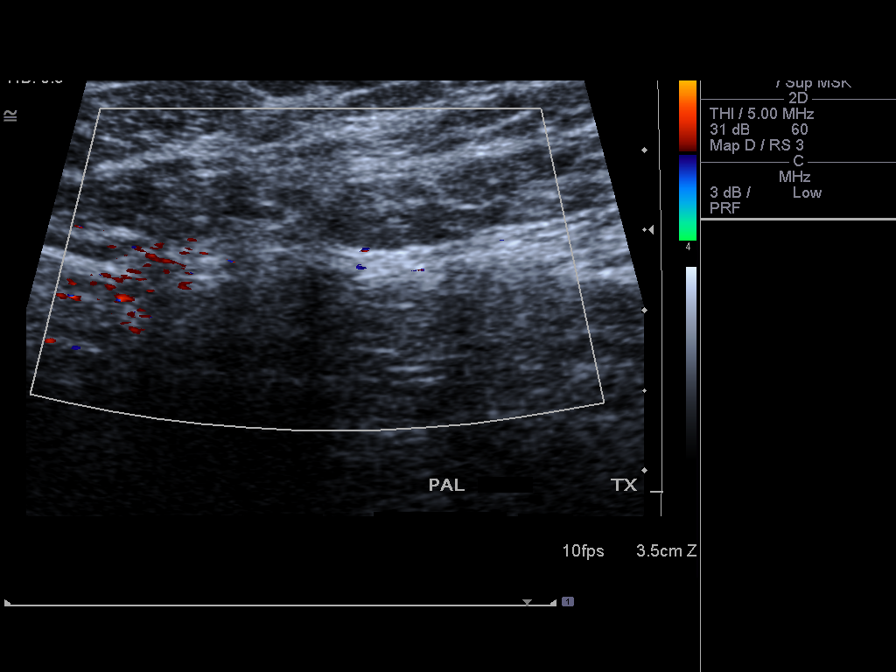

[9 of 9 positions shown; findings below may reference images not displayed]

FINDINGS: Soft tissue mass: None

Complex cyst or fluid collection: None

Hernia: Not applicable:

Other: Benign appearing lymph node identified in the area of the
palpable abnormality. At the point of maximal tenderness within the
left neck a small, nonspecific hypoechoic structure is identified,
images 12-16. This measures approximately a 1.5 x 0.5 cm.
IMPRESSION: 1. No soft tissue mass, fluid collect or adenopathy identified
within the left side of neck in the area of concern.

2. Nonspecific hypoechoic structure within the subcutaneous soft
tissues in the area of maximal tenderness measures 1.5 x 0.5 cm. If
the patient's clinical symptoms persist advise further evaluation
with contrast enhanced CT of the neck.

## 2015-05-16 ENCOUNTER — Ambulatory Visit (INDEPENDENT_AMBULATORY_CARE_PROVIDER_SITE_OTHER): Payer: Medicare Other | Admitting: Cardiology

## 2015-05-16 ENCOUNTER — Encounter: Payer: Self-pay | Admitting: Cardiology

## 2015-05-16 VITALS — BP 130/66 | HR 68 | Ht 66.0 in | Wt 179.0 lb

## 2015-05-16 DIAGNOSIS — I259 Chronic ischemic heart disease, unspecified: Secondary | ICD-10-CM | POA: Diagnosis not present

## 2015-05-16 MED ORDER — NITROGLYCERIN 0.4 MG SL SUBL: 0.4000 mg | SUBLINGUAL_TABLET | SUBLINGUAL | Status: DC | PRN

## 2015-05-16 NOTE — Patient Instructions (Addendum)
Medication Instructions:  Your physician recommends that you continue on your current medications as directed. Please refer to the Current Medication list given to you today.  Labwork: none  Testing/Procedures: none  Follow-Up: Your physician wants you to follow-up in: 1 year ov/ekg with Dr NahserYou will receive a reminder letter in the mail two months in advance. If you don't receive a letter, please call our office to schedule the follow-up appointment.  If you need a refill on your cardiac medications before your next appointment, please call your pharmacy.  

## 2015-05-16 NOTE — Progress Notes (Signed)
Cardiology Office Note   Date:  05/16/2015   ID:  ARYEN KISE, DOB 13-Jan-1951, MRN [id number]  PCP:  Sheela Stack, MD  Cardiologist: Darlin Coco MD  Chief Complaint  Patient presents with  . Chest Pain      History of Present Illness: Tommy Lopez is a 64 y.o. male who presents for  One-year follow-up visit  This is a 64 year old male patient  has a history of coronary artery disease with cath in 2004 showing occlusion of OM 1 treated medically. Most recent stress test in 04/06/13 the EF was 58% with moderate lateral wall ischemia at the mid and basal level. 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 He also has insulin-dependent diabetes mellitus and hypercholesterolemia. The patient is blind and has a  Patient is here for a routine followup visit. He is walking 1 1/2 miles some days and some days he'll walk a total of 5 miles total but not at the same time.  He has had no further chest pain. He's concerned because his diastolic blood pressures in the 50s but has been in the 50s for a long time. He has occasional dizziness that has been attributed to vertigo.  His dizziness tends to occur when he turns a corner to walk in a different direction. He notes occasional mild chest tightness with exertion. It is never severe enough that he has to take a nitroglycerin.  Past Medical History  Diagnosis Date  . IHD (ischemic heart disease)   . Atherosclerotic coronary vascular disease     SINGLE VESSEL OCCULSIVE  . SOB (shortness of breath) on exertion   . Diabetes mellitus   . Hyperlipidemia     Past Surgical History  Procedure Laterality Date  . Cardiac catheterization  08/03/2002    NORMAL LEFT VENTRICULAR SIZE AND EXCELLENT CONTRACTILITY WITH NO SEGMENTAL WALL ABNORMALITIES. EF 70-75%     Current Outpatient Prescriptions  Medication Sig Dispense Refill  . aspirin EC 81 MG tablet Take 81 mg by mouth daily.      . fish oil-omega-3 fatty acids 1000 MG capsule Take 5,000 mg by mouth daily.     . insulin lispro (HUMALOG) 100 UNIT/ML injection Inject into the skin every morning. Inject 14-20 units under the skin daily in the morning.    . insulin NPH (HUMULIN N) 100 UNIT/ML injection Inject into the skin daily before breakfast. Inject 26-28 units under the skin daily in the morning    . loratadine (CLARITIN) 10 MG tablet Take 10 mg by mouth as needed.      . meclizine (ANTIVERT) 25 MG tablet Take 1 tablet (25 mg total) by mouth 3 (three) times daily as needed for dizziness. 30 tablet 0  . metoprolol succinate (TOPROL-XL) 50 MG 24 hr tablet Take 50 mg by mouth daily.    . Multiple Vitamins-Minerals (CENTRUM SILVER) tablet Take 1 tablet by mouth daily.      . mupirocin ointment (BACTROBAN) 2 % Apply 1 application topically as needed. Skin irritation    . nitroGLYCERIN (NITROSTAT) 0.4 MG SL tablet Place 1 tablet (0.4 mg total) under the tongue every 5 (five) minutes as needed for chest pain (chest pain). 25 tablet prn  . omeprazole (PRILOSEC) 40 MG capsule Take 40 mg by mouth daily.     . pravastatin (PRAVACHOL) 40 MG tablet Take 40 mg by mouth daily.      . ramipril (ALTACE) 10 MG  tablet Take 10 mg by mouth daily.      Marland Kitchen zolpidem (AMBIEN) 10 MG tablet Take 10 mg by mouth at bedtime.     No current facility-administered medications for this visit.    Allergies:   Review of patient's allergies indicates no known allergies.    Social History:  The patient  reports that he has never smoked. He does not have any smokeless tobacco history on file. He reports that he does not drink alcohol or use illicit drugs.   Family History:  The patient's family history includes Arthritis in his mother; Heart disease in his father.    ROS:  Please see the history of present illness.   Otherwise, review of systems are positive for none.   All other systems are reviewed and negative.    PHYSICAL EXAM: VS:  BP 130/66  mmHg  Pulse 68  Ht 5\' 6"  (1.676 m)  Wt 179 lb (81.194 kg)  BMI 28.91 kg/m2 , BMI Body mass index is 28.91 kg/(m^2). GEN: Well nourished, well developed, in no acute distress HEENT: normal. The patient is blind Neck: no JVD, carotid bruits, or masses Cardiac: RRR; no murmurs, rubs, or gallops,no edema  Respiratory:  clear to auscultation bilaterally, normal work of breathing GI: soft, nontender, nondistended, + BS MS: no deformity or atrophy Skin: warm and dry, no rash Neuro:  Strength and sensation are intact Psych: euthymic mood, full affect   EKG:  EKG is ordered today. The ekg ordered today demonstrates  Normal sinus rhythm with a pattern of an old inferior infarct.  Since prior tracing of 12/04/13, no significant change   Recent Labs: No results found for requested labs within last 365 days.    Lipid Panel No results found for: CHOL, TRIG, HDL, CHOLHDL, VLDL, LDLCALC, LDLDIRECT    Wt Readings from Last 3 Encounters:  05/16/15 179 lb (81.194 kg)  12/04/13 181 lb 1.9 oz (82.155 kg)  04/05/13 183 lb (83.008 kg)        ASSESSMENT AND PLAN: 1.  Ischemic heart disease. 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 Prior Myoview's have shown predictable reversible ischemia in the distribution of the obtuse marginal 1  2. dizziness suggestive of positional vertigo. We will give him a trial of meclizine. If symptoms fail to resolve we want him to follow up with his PCP. 3.  Insulin-dependent diabetes mellitus 4.  Hypercholesterolemia, on pravastatin, followed by Dr. Forde Dandy  Current medicines are reviewed at length with the patient today.  The patient does not have concerns regarding medicines.  The following changes have been made:  no change  Labs/ tests ordered today include:   Orders Placed This Encounter  Procedures  . EKG 12-Lead      disposition: Continue current medication. We refilled his sublingual nitroglycerin which had expired.  Recheck in one  year for follow-up office visit and EKG  Signed, Darlin Coco MD 05/16/2015 6:18 PM    [facility] [place], [place], [place]  [address] Phone: [phone]; Fax: [phone]

## 2015-11-26 NOTE — Progress Notes (Signed)
HPI: FU coronary artery disease; previously followed by Dr Mare Ferrari; cath in 2004 showing occlusion of OM 1 treated medically. Most recent stress test in 04/06/13 the EF was 58% with moderate lateral wall ischemia at the mid and basal level. 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 He also has insulin-dependent diabetes mellitus and hypercholesterolemia. Since last seen, Patient denies dyspnea, chest pain, palpitations or syncope. He has some dizziness with standing and states his blood pressure is running low at times.   Current Outpatient Prescriptions  Medication Sig Dispense Refill  . Ascorbic Acid (VITAMIN C) 1000 MG tablet Take 1,000 mg by mouth daily.    . diazepam (VALIUM) 2 MG tablet Take 2 mg by mouth as needed (dizziness).     . fish oil-omega-3 fatty acids 1000 MG capsule Take 5,000 mg by mouth daily.     . insulin lispro (HUMALOG) 100 UNIT/ML injection Inject into the skin every morning. Inject 14-20 units under the skin daily in the morning.    . insulin NPH (HUMULIN N) 100 UNIT/ML injection Inject into the skin daily before breakfast. Inject 26-28 units under the skin daily in the morning    . loratadine (CLARITIN) 10 MG tablet Take 10 mg by mouth as needed.      . metoprolol succinate (TOPROL-XL) 50 MG 24 hr tablet Take 50 mg by mouth daily.    . Multiple Vitamins-Minerals (CENTRUM SILVER) tablet Take 1 tablet by mouth daily.      . mupirocin ointment (BACTROBAN) 2 % Apply 1 application topically as needed. Skin irritation    . nitroGLYCERIN (NITROSTAT) 0.4 MG SL tablet Place 1 tablet (0.4 mg total) under the tongue every 5 (five) minutes as needed for chest pain (chest pain). 25 tablet prn  . pravastatin (PRAVACHOL) 40 MG tablet Take 40 mg by mouth daily.      . ramipril (ALTACE) 10 MG tablet Take 10 mg by mouth daily.      Marland Kitchen zolpidem (AMBIEN) 10 MG tablet Take 10 mg by mouth at bedtime.     No current  facility-administered medications for this visit.     Past Medical History  Diagnosis Date  . IHD (ischemic heart disease)   . Atherosclerotic coronary vascular disease     SINGLE VESSEL OCCULSIVE  . SOB (shortness of breath) on exertion   . Diabetes mellitus   . Hyperlipidemia     Past Surgical History  Procedure Laterality Date  . Cardiac catheterization  08/03/2002    NORMAL LEFT VENTRICULAR SIZE AND EXCELLENT CONTRACTILITY WITH NO SEGMENTAL WALL ABNORMALITIES. EF 70-75%    Social History   Social History  . Marital Status: Married    Spouse Name: N/A  . Number of Children: N/A  . Years of Education: N/A   Occupational History  . Not on file.   Social History Main Topics  . Smoking status: Never Smoker   . Smokeless tobacco: Not on file  . Alcohol Use: No  . Drug Use: No  . Sexual Activity: Not on file   Other Topics Concern  . Not on file   Social History Narrative    Family History  Problem Relation Age of Onset  . Arthritis Mother   . Heart disease Father     ROS: no fevers or chills, productive cough, hemoptysis, dysphasia, odynophagia, melena, hematochezia, dysuria, hematuria, rash, seizure activity, orthopnea, PND, pedal edema, claudication. Remaining systems are negative.  Physical Exam: Well-developed well-nourished in no acute distress.  Skin is warm and dry.  HEENT Legally blind Neck is supple.  Chest is clear to auscultation with normal expansion.  Cardiovascular exam is regular rate and rhythm.  Abdominal exam nontender or distended. No masses palpated. Extremities show no edema. neuro grossly intact  ECG Sinus rhythm at a rate of 77. No ST changes.

## 2015-11-27 ENCOUNTER — Ambulatory Visit (INDEPENDENT_AMBULATORY_CARE_PROVIDER_SITE_OTHER): Payer: Medicare Other | Admitting: Cardiology

## 2015-11-27 ENCOUNTER — Encounter: Payer: Self-pay | Admitting: Cardiology

## 2015-11-27 VITALS — BP 143/73 | HR 77 | Ht 66.0 in | Wt 173.1 lb

## 2015-11-27 DIAGNOSIS — I10 Essential (primary) hypertension: Secondary | ICD-10-CM | POA: Insufficient documentation

## 2015-11-27 DIAGNOSIS — I2510 Atherosclerotic heart disease of native coronary artery without angina pectoris: Secondary | ICD-10-CM

## 2015-11-27 DIAGNOSIS — R0989 Other specified symptoms and signs involving the circulatory and respiratory systems: Secondary | ICD-10-CM | POA: Diagnosis not present

## 2015-11-27 MED ORDER — METOPROLOL SUCCINATE ER 50 MG PO TB24
50.0000 mg | ORAL_TABLET | Freq: Every day | ORAL | Status: DC
Start: 2015-11-27 — End: 2016-05-05

## 2015-11-27 NOTE — Assessment & Plan Note (Signed)
Continue aspirin and statin. 

## 2015-11-27 NOTE — Patient Instructions (Signed)
Medication Instructions:   DECREASE METOPROLOL TO 25 MG ONCE DAILY= 1/2 OF THE 50 MG TABLET ONCE DAILY  Testing/Procedures:  Your physician has requested that you have a carotid duplex. This test is an ultrasound of the carotid arteries in your neck. It looks at blood flow through these arteries that supply the brain with blood. Allow one hour for this exam. There are no restrictions or special instructions.    Follow-Up:  Your physician wants you to follow-up in: [place] will receive a reminder letter in the mail two months in advance. If you don't receive a letter, please call our office to schedule the follow-up appointment.   If you need a refill on your cardiac medications before your next appointment, please call your pharmacy.

## 2015-11-27 NOTE — Assessment & Plan Note (Signed)
Continue statin. Lipids and liver monitored by Dr. Forde Dandy.

## 2015-11-27 NOTE — Assessment & Plan Note (Signed)
Patient's blood pressure is running lowAnd he states he has orthostatic symptoms. Decrease Toprol to 25 mg daily and follow. We could decrease ACE inhibitor if needed.

## 2015-11-27 NOTE — Assessment & Plan Note (Signed)
Plan carotid Dopplers for left carotid bruit.

## 2015-11-28 ENCOUNTER — Ambulatory Visit (HOSPITAL_BASED_OUTPATIENT_CLINIC_OR_DEPARTMENT_OTHER)
Admission: RE | Admit: 2015-11-28 | Discharge: 2015-11-28 | Disposition: A | Discharge status: Home / Self Care | Payer: Medicare Other | Source: Ambulatory Visit | Attending: Cardiology | Admitting: Cardiology

## 2015-11-28 DIAGNOSIS — R0989 Other specified symptoms and signs involving the circulatory and respiratory systems: Secondary | ICD-10-CM | POA: Diagnosis present

## 2015-11-28 DIAGNOSIS — I6523 Occlusion and stenosis of bilateral carotid arteries: Secondary | ICD-10-CM | POA: Diagnosis not present

## 2015-12-16 DIAGNOSIS — I6523 Occlusion and stenosis of bilateral carotid arteries: Secondary | ICD-10-CM | POA: Insufficient documentation

## 2016-01-08 ENCOUNTER — Ambulatory Visit: Payer: Medicare Other | Admitting: Cardiology

## 2016-05-04 ENCOUNTER — Telehealth: Payer: Self-pay | Admitting: Cardiology

## 2016-05-04 DIAGNOSIS — I2510 Atherosclerotic heart disease of native coronary artery without angina pectoris: Secondary | ICD-10-CM

## 2016-05-04 NOTE — Telephone Encounter (Signed)
Pt calling stating that he is suppose to be taking Metoprolol 25 mg tablet daily and Dr. Stanford Breed was supposed to be sending a new Rx in for this, but the pt states that he has not received it yet and that he is tired of breaking the Metoprolol 50 mg tablet in half.. Pt would like for this Rx to be sent to [facility]. Please advise

## 2016-05-05 MED ORDER — METOPROLOL SUCCINATE ER 25 MG PO TB24: 25.0000 mg | ORAL_TABLET | Freq: Every day | ORAL | 3 refills | Status: DC

## 2016-05-05 NOTE — Telephone Encounter (Signed)
Refill sent to the pharmacy electronically.  

## 2016-09-24 DIAGNOSIS — M545 Low back pain, unspecified: Secondary | ICD-10-CM | POA: Insufficient documentation

## 2016-09-24 DIAGNOSIS — G589 Mononeuropathy, unspecified: Secondary | ICD-10-CM | POA: Insufficient documentation

## 2017-02-05 ENCOUNTER — Other Ambulatory Visit: Payer: Self-pay | Admitting: Cardiology

## 2017-02-05 DIAGNOSIS — I2510 Atherosclerotic heart disease of native coronary artery without angina pectoris: Secondary | ICD-10-CM

## 2017-02-24 ENCOUNTER — Encounter: Payer: Self-pay | Admitting: Cardiology

## 2017-03-12 NOTE — Progress Notes (Signed)
HPI: FU coronary artery disease; cath in 2004 showing occlusion of OM 1 treated medically. Most recent stress test in 04/06/13 the EF was 58% with moderate lateral wall ischemia at the mid and basal level. 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 He also has insulin-dependent diabetes mellitus and hypercholesterolemia. Carotid Dopplers May 2017 showed less than 50% bilateral stenosis. Since last seen, patient denies dyspnea, chest pain, palpitations or syncope.  Current Outpatient Prescriptions  Medication Sig Dispense Refill  . Ascorbic Acid (VITAMIN C) 1000 MG tablet Take 1,000 mg by mouth daily.    . diazepam (VALIUM) 2 MG tablet Take 2 mg by mouth as needed (dizziness).     . fish oil-omega-3 fatty acids 1000 MG capsule Take 5,000 mg by mouth daily.     . insulin lispro (HUMALOG) 100 UNIT/ML injection Inject into the skin every morning. Inject 14-20 units under the skin daily in the morning.    . insulin NPH (HUMULIN N) 100 UNIT/ML injection Inject into the skin daily before breakfast. Inject 26-28 units under the skin daily in the morning    . loratadine (CLARITIN) 10 MG tablet Take 10 mg by mouth as needed.      . metoprolol succinate (TOPROL-XL) 25 MG 24 hr tablet Take 1 tablet (25 mg total) by mouth daily. 90 tablet 0  . Multiple Vitamins-Minerals (CENTRUM SILVER) tablet Take 1 tablet by mouth daily.      . mupirocin ointment (BACTROBAN) 2 % Apply 1 application topically as needed. Skin irritation    . nitroGLYCERIN (NITROSTAT) 0.4 MG SL tablet Place 1 tablet (0.4 mg total) under the tongue every 5 (five) minutes as needed for chest pain (chest pain). 25 tablet prn  . pravastatin (PRAVACHOL) 40 MG tablet Take 40 mg by mouth daily.      . ramipril (ALTACE) 10 MG tablet Take 10 mg by mouth daily.      Marland Kitchen zolpidem (AMBIEN) 10 MG tablet Take 10 mg by mouth at bedtime.     No current facility-administered medications for this  visit.      Past Medical History:  Diagnosis Date  . Atherosclerotic coronary vascular disease    SINGLE VESSEL OCCULSIVE  . Diabetes mellitus   . Hyperlipidemia   . IHD (ischemic heart disease)   . SOB (shortness of breath) on exertion     Past Surgical History:  Procedure Laterality Date  . CARDIAC CATHETERIZATION  08/03/2002   NORMAL LEFT VENTRICULAR SIZE AND EXCELLENT CONTRACTILITY WITH NO SEGMENTAL WALL ABNORMALITIES. EF 70-75%    Social History   Social History  . Marital status: Married    Spouse name: N/A  . Number of children: N/A  . Years of education: N/A   Occupational History  . Not on file.   Social History Main Topics  . Smoking status: Never Smoker  . Smokeless tobacco: Never Used  . Alcohol use No  . Drug use: No  . Sexual activity: Not on file   Other Topics Concern  . Not on file   Social History Narrative  . No narrative on file    Family History  Problem Relation Age of Onset  . Arthritis Mother   . Heart disease Father     ROS: Some pain in his foot but no fevers or chills, productive cough, hemoptysis, dysphasia, odynophagia, melena, hematochezia, dysuria, hematuria, rash, seizure activity, orthopnea, PND, pedal edema, claudication. Remaining systems are  negative.  Physical Exam: Well-developed well-nourished in no acute distress.  Skin is warm and dry.  HEENT blind Neck is supple.  Chest is clear to auscultation with normal expansion.  Cardiovascular exam is regular rate and rhythm.  Abdominal exam nontender or distended. No masses palpated. Extremities show no edema. neuro grossly intact  ECG-Sinus rhythm at a rate of 71. No ST changes. RV conduction delay. personally reviewed  A/P  1 coronary artery disease-patient has 55 years of diabetes mellitus. We will arrange a [place] nuclear study to make sure that his coronary artery disease has not progressed. Continue aspirin and statin.   2 Hypertension-blood pressure is  controlled. Continue present medications.  3 hyperlipidemia-continue statin. Lipids and liver monitored by Dr. Forde Dandy.  Kirk Ruths, MD

## 2017-03-17 ENCOUNTER — Telehealth: Payer: Self-pay | Admitting: Cardiology

## 2017-03-17 ENCOUNTER — Encounter: Payer: Self-pay | Admitting: Cardiology

## 2017-03-17 ENCOUNTER — Ambulatory Visit (INDEPENDENT_AMBULATORY_CARE_PROVIDER_SITE_OTHER): Payer: Medicare Other | Admitting: Cardiology

## 2017-03-17 VITALS — BP 136/66 | HR 71 | Ht 66.0 in | Wt 177.0 lb

## 2017-03-17 DIAGNOSIS — R300 Dysuria: Secondary | ICD-10-CM | POA: Diagnosis not present

## 2017-03-17 DIAGNOSIS — E7800 Pure hypercholesterolemia, unspecified: Secondary | ICD-10-CM

## 2017-03-17 DIAGNOSIS — I10 Essential (primary) hypertension: Secondary | ICD-10-CM | POA: Diagnosis not present

## 2017-03-17 DIAGNOSIS — I2510 Atherosclerotic heart disease of native coronary artery without angina pectoris: Secondary | ICD-10-CM

## 2017-03-17 NOTE — Patient Instructions (Signed)
Your physician wants you to follow-up in: ONE YEAR WITH DR CRENSHAW You will receive a reminder letter in the mail two months in advance. If you don't receive a letter, please call our office to schedule the follow-up appointment.   If you need a refill on your cardiac medications before your next appointment, please call your pharmacy.  

## 2017-03-17 NOTE — Telephone Encounter (Signed)
Lm will call back with fax number

## 2017-03-17 NOTE — Telephone Encounter (Signed)
Tommy Lopez fax number to fax order to [facility] in [place] PHONE NUMBER IS [phone].

## 2017-03-17 NOTE — Telephone Encounter (Signed)
New message     When pt is in office can you do a urinalysis on him? Please call

## 2017-03-18 LAB — URINALYSIS
BILIRUBIN URINE: NEGATIVE
GLUCOSE, UA: NEGATIVE
Hgb urine dipstick: NEGATIVE
Ketones, ur: NEGATIVE
LEUKOCYTES UA: NEGATIVE
Nitrite: NEGATIVE
PH: 7 (ref 5.0–8.0)
Protein, ur: NEGATIVE
SPECIFIC GRAVITY, URINE: 1.013 (ref 1.001–1.03)

## 2017-03-26 ENCOUNTER — Telehealth: Payer: Self-pay | Admitting: Cardiology

## 2017-03-26 NOTE — Telephone Encounter (Signed)
Tisha called from Dr. Baldwin Crown office and stated that the patient needed to have a micro urinalysis and not a urinalysis. The urinalysis was completed on 9/12. The redo lab may need to be done at the PCP office. Will route to the provider's nurse for her knowledge and recommendation.

## 2017-03-29 ENCOUNTER — Telehealth: Payer: Self-pay | Admitting: Cardiology

## 2017-03-29 NOTE — Telephone Encounter (Signed)
Left message for Tommy Lopez, ua will need to be completed by the PCP at their location.

## 2017-03-29 NOTE — Telephone Encounter (Signed)
F/u Message   Tisha from [facility] returning RN call. Please call back to discuss

## 2017-03-29 NOTE — Telephone Encounter (Signed)
Called back to [facility], was put through to Lisa's VM. Advised to refer to Debra's msg this morning regarding need for PCP to order UA w microscopy, if further questions feel free to call us back.

## 2017-08-11 ENCOUNTER — Other Ambulatory Visit: Payer: Self-pay | Admitting: Cardiology

## 2017-08-11 DIAGNOSIS — I2510 Atherosclerotic heart disease of native coronary artery without angina pectoris: Secondary | ICD-10-CM

## 2017-11-10 ENCOUNTER — Other Ambulatory Visit: Payer: Self-pay | Admitting: Cardiology

## 2017-11-10 DIAGNOSIS — I2510 Atherosclerotic heart disease of native coronary artery without angina pectoris: Secondary | ICD-10-CM

## 2017-11-10 NOTE — Telephone Encounter (Signed)
Rx(s) sent to pharmacy electronically.  

## 2017-11-30 ENCOUNTER — Encounter: Payer: Self-pay | Admitting: Cardiology

## 2018-04-01 NOTE — Progress Notes (Signed)
HPI: FU coronary artery disease; cath in 2004 showing occlusion of OM 1 treated medically. Most recent stress test in 04/06/13 the EF was 58% with moderate lateral wall ischemia at the mid and basal level. 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 He also has insulin-dependent diabetes mellitus and hypercholesterolemia. Carotid Dopplers May 2017 showed less than 50% bilateral stenosis.   Nuclear study scheduled at last office visit but not performed.  Since last seen, he has noticed that after walks he has had increased perspiration which is new.  He notes a flushing sensation in his face as well.  He denies dyspnea on exertion, orthopnea, PND, pedal edema, chest pain or syncope.  He has had some episodes of dizziness.  He was seen by neurology yesterday and carotid Dopplers have been ordered.  He also had blood work drawn results pending.  Current Outpatient Medications  Medication Sig Dispense Refill  . Ascorbic Acid (VITAMIN C) 1000 MG tablet Take 1,000 mg by mouth daily.    . diazepam (VALIUM) 2 MG tablet Take 2 mg by mouth as needed (dizziness).     . fish oil-omega-3 fatty acids 1000 MG capsule Take 5,000 mg by mouth daily.     . insulin lispro (HUMALOG) 100 UNIT/ML injection Inject into the skin every morning. Inject 14-20 units under the skin daily in the morning.    . insulin NPH (HUMULIN N) 100 UNIT/ML injection Inject into the skin daily before breakfast. Inject 26-28 units under the skin daily in the morning    . loratadine (CLARITIN) 10 MG tablet Take 10 mg by mouth as needed.      . metoprolol succinate (TOPROL-XL) 25 MG 24 hr tablet Take 1 tablet (25 mg total) by mouth daily. 90 tablet 1  . Multiple Vitamins-Minerals (CENTRUM SILVER) tablet Take 1 tablet by mouth daily.      . mupirocin ointment (BACTROBAN) 2 % Apply 1 application topically as needed. Skin irritation    . nitroGLYCERIN (NITROSTAT) 0.4 MG SL tablet Place 1  tablet (0.4 mg total) under the tongue every 5 (five) minutes as needed for chest pain (chest pain). 25 tablet prn  . pravastatin (PRAVACHOL) 40 MG tablet Take 40 mg by mouth daily.      . ramipril (ALTACE) 10 MG tablet Take 10 mg by mouth daily.      Marland Kitchen zolpidem (AMBIEN) 10 MG tablet Take 10 mg by mouth at bedtime.     No current facility-administered medications for this visit.      Past Medical History:  Diagnosis Date  . Atherosclerotic coronary vascular disease    SINGLE VESSEL OCCULSIVE  . Diabetes mellitus   . Hyperlipidemia   . IHD (ischemic heart disease)   . SOB (shortness of breath) on exertion     Past Surgical History:  Procedure Laterality Date  . CARDIAC CATHETERIZATION  08/03/2002   NORMAL LEFT VENTRICULAR SIZE AND EXCELLENT CONTRACTILITY WITH NO SEGMENTAL WALL ABNORMALITIES. EF 70-75%    Social History   Socioeconomic History  . Marital status: Married    Spouse name: Not on file  . Number of children: Not on file  . Years of education: Not on file  . Highest education level: Not on file  Occupational History  . Not on file  Social Needs  . Financial resource strain: Not on file  . Food insecurity:    Worry: Not on file  Inability: Not on file  . Transportation needs:    Medical: Not on file    Non-medical: Not on file  Tobacco Use  . Smoking status: Never Smoker  . Smokeless tobacco: Never Used  Substance and Sexual Activity  . Alcohol use: No  . Drug use: No  . Sexual activity: Not on file  Lifestyle  . Physical activity:    Days per week: Not on file    Minutes per session: Not on file  . Stress: Not on file  Relationships  . Social connections:    Talks on phone: Not on file    Gets together: Not on file    Attends religious service: Not on file    Active member of club or organization: Not on file    Attends meetings of clubs or organizations: Not on file    Relationship status: Not on file  . Intimate partner violence:    Fear of  current or ex partner: Not on file    Emotionally abused: Not on file    Physically abused: Not on file    Forced sexual activity: Not on file  Other Topics Concern  . Not on file  Social History Narrative  . Not on file    Family History  Problem Relation Age of Onset  . Arthritis Mother   . Heart disease Father     ROS: no fevers or chills, productive cough, hemoptysis, dysphasia, odynophagia, melena, hematochezia, dysuria, hematuria, rash, seizure activity, orthopnea, PND, pedal edema, claudication. Remaining systems are negative.  Physical Exam: Well-developed well-nourished in no acute distress.  Skin is warm and dry.  HEENT is normal.  Neck is supple.  Chest is clear to auscultation with normal expansion.  Cardiovascular exam is regular rate and rhythm.  Abdominal exam nontender or distended. No masses palpated. Extremities show no edema. neuro grossly intact  ECG-sinus rhythm at a rate of 77.  RV conduction delay.  Personally reviewed  A/P  1 coronary artery disease-He has known coronary artery disease with occluded marginal.  This corresponds with his previous nuclear study showing lateral ischemia.  Plan to continue medical therapy with aspirin and statin.  He is describing increased perspiration/clammy feeling following walks.  Long history of diabetes mellitus.  We will arrange a [place] nuclear study for risk stratification.  Repeat echocardiogram to reassess LV function.  2 hypertension-pressure is mildly elevated.  Continue present medications and follow.  3 hyperlipidemia-continue statin.  Lipids and liver are monitored by his primary care physician.  4 dizziness-seen by neurology recently and carotid Dopplers ordered with results pending.  We will await final results.  Kirk Ruths, MD

## 2018-04-06 ENCOUNTER — Ambulatory Visit: Payer: Medicare Other | Admitting: Cardiology

## 2018-04-06 ENCOUNTER — Encounter: Payer: Self-pay | Admitting: Cardiology

## 2018-04-06 VITALS — BP 140/60 | HR 77 | Ht 66.0 in | Wt 175.0 lb

## 2018-04-06 DIAGNOSIS — I10 Essential (primary) hypertension: Secondary | ICD-10-CM | POA: Diagnosis not present

## 2018-04-06 DIAGNOSIS — I2510 Atherosclerotic heart disease of native coronary artery without angina pectoris: Secondary | ICD-10-CM

## 2018-04-06 DIAGNOSIS — E7800 Pure hypercholesterolemia, unspecified: Secondary | ICD-10-CM

## 2018-04-06 NOTE — Patient Instructions (Signed)
Medication Instructions:   NO CHANGE  Testing/Procedures:  Your physician has requested that you have an echocardiogram. Echocardiography is a painless test that uses sound waves to create images of your heart. It provides your doctor with information about the size and shape of your heart and how well your heart's chambers and valves are working. This procedure takes approximately one hour. There are no restrictions for this procedure.   Your physician has requested that you have a lexiscan myoview. For further information please visit [email or web address]. Please follow instruction sheet, as given.AT [place]:  Your physician recommends that you schedule a follow-up appointment in: [place]   If you need a refill on your cardiac medications before your next appointment, please call your pharmacy.

## 2018-04-07 ENCOUNTER — Telehealth (HOSPITAL_COMMUNITY): Payer: Self-pay

## 2018-04-07 NOTE — Telephone Encounter (Signed)
Pt contacted, instructions given and understood. Pt stated he would be here. S.Dael Howland  EMTP

## 2018-04-14 ENCOUNTER — Other Ambulatory Visit: Payer: Self-pay

## 2018-04-14 ENCOUNTER — Ambulatory Visit (HOSPITAL_COMMUNITY): Payer: Medicare Other | Attending: Cardiovascular Disease

## 2018-04-14 ENCOUNTER — Ambulatory Visit (HOSPITAL_BASED_OUTPATIENT_CLINIC_OR_DEPARTMENT_OTHER): Payer: Medicare Other

## 2018-04-14 DIAGNOSIS — I2510 Atherosclerotic heart disease of native coronary artery without angina pectoris: Secondary | ICD-10-CM

## 2018-04-14 LAB — ECHOCARDIOGRAM COMPLETE
Height: 66 in
Weight: 2800 oz

## 2018-04-14 LAB — MYOCARDIAL PERFUSION IMAGING
CHL CUP NUCLEAR SRS: 2
CHL CUP NUCLEAR SSS: 8
CSEPPHR: 99 {beats}/min
LV sys vol: 25 mL
LVDIAVOL: 60 mL (ref 62–150)
Rest HR: 79 {beats}/min
SDS: 6
TID: 0.97

## 2018-04-14 MED ORDER — TECHNETIUM TC 99M TETROFOSMIN IV KIT
10.2000 | PACK | Freq: Once | INTRAVENOUS | Status: AC | PRN
  Administered 2018-04-14: 10.2 via INTRAVENOUS
  Filled 2018-04-14: qty 11

## 2018-04-14 MED ORDER — TECHNETIUM TC 99M TETROFOSMIN IV KIT
31.7000 | PACK | Freq: Once | INTRAVENOUS | Status: AC | PRN
  Administered 2018-04-14: 31.7 via INTRAVENOUS
  Filled 2018-04-14: qty 32

## 2018-04-14 MED ORDER — PERFLUTREN LIPID MICROSPHERE
1.0000 mL | INTRAVENOUS | Status: AC | PRN
  Administered 2018-04-14: 3 mL via INTRAVENOUS

## 2018-04-14 MED ORDER — REGADENOSON 0.4 MG/5ML IV SOLN
0.4000 mg | Freq: Once | INTRAVENOUS | Status: AC
  Administered 2018-04-14: 0.4 mg via INTRAVENOUS

## 2018-04-15 ENCOUNTER — Other Ambulatory Visit: Payer: Self-pay | Admitting: *Deleted

## 2018-04-15 MED ORDER — NITROGLYCERIN 0.4 MG SL SUBL: 0.4000 mg | SUBLINGUAL_TABLET | SUBLINGUAL | 99 refills | Status: DC | PRN

## 2018-05-16 ENCOUNTER — Other Ambulatory Visit: Payer: Self-pay | Admitting: Cardiology

## 2018-05-16 DIAGNOSIS — I2510 Atherosclerotic heart disease of native coronary artery without angina pectoris: Secondary | ICD-10-CM

## 2018-05-24 NOTE — Progress Notes (Signed)
HPI: FU coronary artery disease; cath in 2004 showing occlusion of OM 1 treated medically. Nuclear study October 2019 showed ejection fraction 58% and no ischemia or infarction.  Echocardiogram October 2019 showed normal LV function, trace aortic insufficiency, mild left atrial enlargement.   Carotid Dopplers October 2019 showed no hemodynamically significant stenosis.  Since last seen, the patient has dyspnea with more extreme activities but not with routine activities. It is relieved with rest. It is not associated with chest pain. There is no orthopnea, PND or pedal edema. There is no syncope or palpitations. There is no exertional chest pain.   Current Outpatient Medications  Medication Sig Dispense Refill  . Ascorbic Acid (VITAMIN C) 1000 MG tablet Take 1,000 mg by mouth daily.    . diazepam (VALIUM) 2 MG tablet Take 2 mg by mouth as needed (dizziness).     . fish oil-omega-3 fatty acids 1000 MG capsule Take 5,000 mg by mouth daily.     . insulin lispro (HUMALOG) 100 UNIT/ML injection Inject into the skin every morning. Inject 14-20 units under the skin daily in the morning.    . insulin NPH (HUMULIN N) 100 UNIT/ML injection Inject into the skin daily before breakfast. Inject 26-28 units under the skin daily in the morning    . loratadine (CLARITIN) 10 MG tablet Take 10 mg by mouth as needed.      . meloxicam (MOBIC) 15 MG tablet Take 15 mg by mouth daily.    . metoprolol succinate (TOPROL-XL) 25 MG 24 hr tablet TAKE ONE TABLET DAILY 90 tablet 3  . Multiple Vitamins-Minerals (CENTRUM SILVER) tablet Take 1 tablet by mouth daily.      . mupirocin ointment (BACTROBAN) 2 % Apply 1 application topically as needed. Skin irritation    . nitroGLYCERIN (NITROSTAT) 0.4 MG SL tablet Place 1 tablet (0.4 mg total) under the tongue every 5 (five) minutes as needed for chest pain (chest pain). 25 tablet prn  . pravastatin (PRAVACHOL) 40 MG tablet Take 40 mg by mouth daily.      . ramipril (ALTACE)  10 MG tablet Take 10 mg by mouth daily.      Marland Kitchen zolpidem (AMBIEN) 10 MG tablet Take 10 mg by mouth at bedtime.     No current facility-administered medications for this visit.      Past Medical History:  Diagnosis Date  . Atherosclerotic coronary vascular disease    SINGLE VESSEL OCCULSIVE  . Diabetes mellitus   . Hyperlipidemia   . IHD (ischemic heart disease)   . SOB (shortness of breath) on exertion     Past Surgical History:  Procedure Laterality Date  . CARDIAC CATHETERIZATION  08/03/2002   NORMAL LEFT VENTRICULAR SIZE AND EXCELLENT CONTRACTILITY WITH NO SEGMENTAL WALL ABNORMALITIES. EF 70-75%    Social History   Socioeconomic History  . Marital status: Married    Spouse name: Not on file  . Number of children: Not on file  . Years of education: Not on file  . Highest education level: Not on file  Occupational History  . Not on file  Social Needs  . Financial resource strain: Not on file  . Food insecurity:    Worry: Not on file    Inability: Not on file  . Transportation needs:    Medical: Not on file    Non-medical: Not on file  Tobacco Use  . Smoking status: Never Smoker  . Smokeless tobacco: Never Used  Substance and Sexual Activity  .  Alcohol use: No  . Drug use: No  . Sexual activity: Not on file  Lifestyle  . Physical activity:    Days per week: Not on file    Minutes per session: Not on file  . Stress: Not on file  Relationships  . Social connections:    Talks on phone: Not on file    Gets together: Not on file    Attends religious service: Not on file    Active member of club or organization: Not on file    Attends meetings of clubs or organizations: Not on file    Relationship status: Not on file  . Intimate partner violence:    Fear of current or ex partner: Not on file    Emotionally abused: Not on file    Physically abused: Not on file    Forced sexual activity: Not on file  Other Topics Concern  . Not on file  Social History  Narrative  . Not on file    Family History  Problem Relation Age of Onset  . Arthritis Mother   . Heart disease Father     ROS: no fevers or chills, productive cough, hemoptysis, dysphasia, odynophagia, melena, hematochezia, dysuria, hematuria, rash, seizure activity, orthopnea, PND, pedal edema, claudication. Remaining systems are negative.  Physical Exam: Well-developed well-nourished in no acute distress.  Skin is warm and dry.  HEENT is normal.  Neck is supple.  Chest is clear to auscultation with normal expansion.  Cardiovascular exam is regular rate and rhythm.  Abdominal exam nontender or distended. No masses palpated. Extremities show no edema. neuro grossly intact  A/P  1 coronary artery disease-patient has known coronary disease as outlined in HPI with an occluded marginal.  He denies chest pain and recent nuclear study performed for perspiration/clammy feeling showed no ischemia.  We will continue with medical therapy.  Echocardiogram shows normal LV function.  2 hypertension-blood pressure is controlled today.  Continue present medications and follow.  3 hyperlipidemia-given documented coronary disease I will increase pravastatin to 80 mg daily.  Check lipids and liver in 4 weeks.  Will consider changing to Crestor or Lipitor in the future.  4 dizziness-some improvement in symptoms.    Kirk Ruths, MD

## 2018-06-06 ENCOUNTER — Encounter: Payer: Self-pay | Admitting: Cardiology

## 2018-06-06 ENCOUNTER — Ambulatory Visit: Payer: Medicare Other | Admitting: Cardiology

## 2018-06-06 VITALS — BP 130/60 | HR 70 | Ht 66.0 in | Wt 180.6 lb

## 2018-06-06 DIAGNOSIS — I2510 Atherosclerotic heart disease of native coronary artery without angina pectoris: Secondary | ICD-10-CM | POA: Diagnosis not present

## 2018-06-06 DIAGNOSIS — E7800 Pure hypercholesterolemia, unspecified: Secondary | ICD-10-CM | POA: Diagnosis not present

## 2018-06-06 DIAGNOSIS — I10 Essential (primary) hypertension: Secondary | ICD-10-CM | POA: Diagnosis not present

## 2018-06-06 MED ORDER — PRAVASTATIN SODIUM 80 MG PO TABS: 80.0000 mg | ORAL_TABLET | Freq: Every evening | ORAL | 3 refills | Status: DC

## 2018-06-06 NOTE — Patient Instructions (Signed)
Medication Instructions:  INCREASE PRAVASTATIN TO 80 MG ONCE DAILY If you need a refill on your cardiac medications before your next appointment, please call your pharmacy.   Lab work: Your physician recommends that you return for lab work in: [place] If you have labs (blood work) drawn today and your tests are completely normal, you will receive your results only by: Marland Kitchen MyChart Message (if you have MyChart) OR . A paper copy in the mail If you have any lab test that is abnormal or we need to change your treatment, we will call you to review the results.  Follow-Up: At [facility], you and your health needs are our priority.  As part of our continuing mission to provide you with exceptional heart care, we have created designated Provider Care Teams.  These Care Teams include your primary Cardiologist (physician) and Advanced Practice Providers (APPs -  Physician Assistants and Nurse Practitioners) who all work together to provide you with the care you need, when you need it. You will need a follow up appointment in 12 months.  Please call our office 2 months in advance to schedule this appointment.  You may see Kirk Ruths MD or one of the following Advanced Practice Providers on your designated Care Team:   Kerin Ransom, PA-C Roby Lofts, [place] . Sande Rives, PA-C

## 2018-07-16 LAB — LIPID PANEL
CHOL/HDL RATIO: 3.1 ratio (ref 0.0–5.0)
CHOLESTEROL TOTAL: 110 mg/dL (ref 100–199)
HDL: 35 mg/dL — AB (ref >39)
LDL Calculated: 58 mg/dL (ref 0–99)
Triglycerides: 87 mg/dL (ref 0–149)
VLDL CHOLESTEROL CAL: 17 mg/dL (ref 5–40)

## 2018-07-16 LAB — HEPATIC FUNCTION PANEL
ALT: 18 IU/L (ref 0–44)
AST: 22 IU/L (ref 0–40)
Albumin: 4.1 g/dL (ref 3.6–4.8)
Alkaline Phosphatase: 67 IU/L (ref 39–117)
Bilirubin Total: 0.5 mg/dL (ref 0.0–1.2)
Bilirubin, Direct: 0.16 mg/dL (ref 0.00–0.40)
Total Protein: 6.9 g/dL (ref 6.0–8.5)

## 2018-07-18 ENCOUNTER — Encounter: Payer: Self-pay | Admitting: *Deleted

## 2019-05-12 ENCOUNTER — Other Ambulatory Visit: Payer: Self-pay | Admitting: Cardiology

## 2019-05-12 DIAGNOSIS — I2510 Atherosclerotic heart disease of native coronary artery without angina pectoris: Secondary | ICD-10-CM

## 2019-06-20 NOTE — Progress Notes (Signed)
Virtual Visit via Video Note changed to phone visit at patient request   This visit type was conducted due to national recommendations for restrictions regarding the COVID-19 Pandemic (e.g. social distancing) in an effort to limit this patient's exposure and mitigate transmission in our community.  Due to his co-morbid illnesses, this patient is at least at moderate risk for complications without adequate follow up.  This format is felt to be most appropriate for this patient at this time.  All issues noted in this document were discussed and addressed.  A limited physical exam was performed with this format.  Please refer to the patient's chart for his consent to telehealth for [facility].   Date:  06/23/2019   ID:  Tommy Lopez, DOB 30-May-1951, MRN [id number]  Patient Location:Home Provider Location: Home  PCP:  Reynold Bowen, MD  Cardiologist:  Dr Stanford Breed  Evaluation Performed:  Follow-Up Visit  Chief Complaint:  FU CAD  History of Present Illness:     FU coronary artery disease; cath in 2004 showing occlusion of OM 1 treated medically. Nuclear study October 2019 showed ejection fraction 58% and no ischemia or infarction.  Echocardiogram October 2019 showed normal LV function, trace aortic insufficiency, mild left atrial enlargement.Carotid Dopplers October 2019 showed no hemodynamically significant stenosis.  Since last seen, the patient has dyspnea with more extreme activities but not with routine activities. It is relieved with rest. It is not associated with chest pain. There is no orthopnea, PND or pedal edema. There is no syncope or palpitations. There is no exertional chest pain.   The patient does not have symptoms concerning for COVID-19 infection (fever, chills, cough, or new shortness of breath).    Past Medical History:  Diagnosis Date   Atherosclerotic coronary vascular disease    SINGLE VESSEL OCCULSIVE   Diabetes mellitus    Hyperlipidemia    IHD  (ischemic heart disease)    SOB (shortness of breath) on exertion    Past Surgical History:  Procedure Laterality Date   CARDIAC CATHETERIZATION  08/03/2002   NORMAL LEFT VENTRICULAR SIZE AND EXCELLENT CONTRACTILITY WITH NO SEGMENTAL WALL ABNORMALITIES. EF 70-75%     Current Meds  Medication Sig   Ascorbic Acid (VITAMIN C) 1000 MG tablet Take 1,000 mg by mouth daily.   aspirin EC 81 MG tablet Take 81 mg by mouth daily.   diazepam (VALIUM) 2 MG tablet Take 2 mg by mouth as needed (dizziness).    fish oil-omega-3 fatty acids 1000 MG capsule Take 5,000 mg by mouth daily.    insulin lispro (HUMALOG) 100 UNIT/ML injection Inject into the skin every morning. Inject 14-20 units under the skin daily in the morning.   insulin NPH (HUMULIN N) 100 UNIT/ML injection Inject into the skin daily before breakfast. Inject 26-28 units under the skin daily in the morning   loratadine (CLARITIN) 10 MG tablet Take 10 mg by mouth as needed.     metoprolol succinate (TOPROL-XL) 25 MG 24 hr tablet TAKE ONE TABLET BY MOUTH EVERY DAY   Multiple Vitamins-Minerals (CENTRUM SILVER) tablet Take 1 tablet by mouth daily.     mupirocin ointment (BACTROBAN) 2 % Apply 1 application topically as needed. Skin irritation   nitroGLYCERIN (NITROSTAT) 0.4 MG SL tablet Place 1 tablet (0.4 mg total) under the tongue every 5 (five) minutes as needed for chest pain (chest pain).   pravastatin (PRAVACHOL) 40 MG tablet Take 40 mg by mouth daily.   ramipril (ALTACE) 10 MG tablet Take  10 mg by mouth daily.     zolpidem (AMBIEN) 10 MG tablet Take 10 mg by mouth at bedtime.   [DISCONTINUED] meloxicam (MOBIC) 15 MG tablet Take 15 mg by mouth daily.     Allergies:   Patient has no known allergies.   Social History   Tobacco Use   Smoking status: Never Smoker   Smokeless tobacco: Never Used  Substance Use Topics   Alcohol use: No   Drug use: No     Family Hx: The patient's family history includes Arthritis in  his mother; Heart disease in his father.  ROS:   Please see the history of present illness.    No Fever, chills  or productive cough; some stomach pain All other systems reviewed and are negative.   Recent Labs: 07/15/2018: ALT 18   Recent Lipid Panel Lab Results  Component Value Date/Time   CHOL 110 07/15/2018 09:22 AM   TRIG 87 07/15/2018 09:22 AM   HDL 35 (L) 07/15/2018 09:22 AM   CHOLHDL 3.1 07/15/2018 09:22 AM   LDLCALC 58 07/15/2018 09:22 AM    Wt Readings from Last 3 Encounters:  06/23/19 177 lb (80.3 kg)  06/06/18 180 lb 9.6 oz (81.9 kg)  04/14/18 175 lb (79.4 kg)     Objective:    Vital Signs:  BP 138/65    Pulse 68    Ht 5\' 6"  (1.676 m)    Wt 177 lb (80.3 kg)    BMI 28.57 kg/m    VITAL SIGNS:  reviewed NAD Answers questions appropriately Normal affect Remainder of physical examination not performed (telehealth visit; coronavirus pandemic)  ASSESSMENT & PLAN:    1. Coronary artery disease-patient has not had recurrent chest pain.  Plan to continue medical therapy with aspirin and statin. 2. Hypertension-blood pressure controlled.  Continue present medical regimen and follow.  Check potassium and renal function. 3. Hyperlipidemia-continue statin.   COVID-19 Education: The importance of social distancing was discussed today.  Time:   Today, I have spent 18 minutes with the patient with telehealth technology discussing the above problems.     Medication Adjustments/Labs and Tests Ordered: Current medicines are reviewed at length with the patient today.  Concerns regarding medicines are outlined above.   Tests Ordered: No orders of the defined types were placed in this encounter.   Medication Changes: No orders of the defined types were placed in this encounter.   Follow Up:  Either In Person or Virtual in 1 year(s)  Signed, Kirk Ruths, MD  06/23/2019 10:38 AM    [place]

## 2019-06-23 ENCOUNTER — Encounter: Payer: Self-pay | Admitting: Cardiology

## 2019-06-23 ENCOUNTER — Telehealth (INDEPENDENT_AMBULATORY_CARE_PROVIDER_SITE_OTHER): Payer: Medicare Other | Admitting: Cardiology

## 2019-06-23 VITALS — BP 138/65 | HR 68 | Ht 66.0 in | Wt 177.0 lb

## 2019-06-23 DIAGNOSIS — I2510 Atherosclerotic heart disease of native coronary artery without angina pectoris: Secondary | ICD-10-CM | POA: Diagnosis not present

## 2019-06-23 DIAGNOSIS — E7800 Pure hypercholesterolemia, unspecified: Secondary | ICD-10-CM

## 2019-06-23 DIAGNOSIS — I10 Essential (primary) hypertension: Secondary | ICD-10-CM | POA: Diagnosis not present

## 2019-06-23 NOTE — Patient Instructions (Signed)

## 2019-07-26 ENCOUNTER — Encounter (INDEPENDENT_AMBULATORY_CARE_PROVIDER_SITE_OTHER): Payer: Self-pay

## 2019-07-26 ENCOUNTER — Ambulatory Visit: Payer: Medicare Other | Admitting: Cardiology

## 2019-07-26 ENCOUNTER — Encounter: Payer: Self-pay | Admitting: Cardiology

## 2019-07-26 ENCOUNTER — Other Ambulatory Visit: Payer: Self-pay

## 2019-07-26 VITALS — BP 140/60 | HR 72 | Temp 98.1°F | Ht 66.0 in | Wt 172.0 lb

## 2019-07-26 DIAGNOSIS — I2510 Atherosclerotic heart disease of native coronary artery without angina pectoris: Secondary | ICD-10-CM

## 2019-07-26 DIAGNOSIS — I10 Essential (primary) hypertension: Secondary | ICD-10-CM | POA: Diagnosis not present

## 2019-07-26 DIAGNOSIS — E7800 Pure hypercholesterolemia, unspecified: Secondary | ICD-10-CM | POA: Diagnosis not present

## 2019-07-26 MED ORDER — AMLODIPINE BESYLATE 5 MG PO TABS: 5.0000 mg | ORAL_TABLET | Freq: Every day | ORAL | 3 refills | Status: DC

## 2019-07-26 NOTE — Progress Notes (Signed)
HPI: FU coronary artery disease; cath in 2004 showing occlusion of OM 1 treated medically. Nuclear study October 2019 showed ejection fraction 58% and no ischemia or infarction. Echocardiogram October 2019 showed normal LV function, trace aortic insufficiency, mild left atrial enlargement.  Carotid Dopplers October 2019 showed no hemodynamically significant stenosis. Since last seen,  October he notes increased dyspnea on exertion, fatigue and some chest tightness with more vigorous activities relieved with rest.  He has not had symptoms at rest.  No syncope or pedal edema.  Current Outpatient Medications  Medication Sig Dispense Refill  . Ascorbic Acid (VITAMIN C) 1000 MG tablet Take 1,000 mg by mouth daily.    Marland Kitchen aspirin EC 81 MG tablet Take 81 mg by mouth daily.    . diazepam (VALIUM) 2 MG tablet Take 2 mg by mouth as needed (dizziness).     . fish oil-omega-3 fatty acids 1000 MG capsule Take 5,000 mg by mouth daily.     . insulin lispro (HUMALOG) 100 UNIT/ML injection Inject into the skin every morning. Inject 14-20 units under the skin daily in the morning.    . insulin NPH (HUMULIN N) 100 UNIT/ML injection Inject into the skin daily before breakfast. Inject 26-28 units under the skin daily in the morning    . loratadine (CLARITIN) 10 MG tablet Take 10 mg by mouth as needed.      . metoprolol succinate (TOPROL-XL) 25 MG 24 hr tablet TAKE ONE TABLET BY MOUTH EVERY DAY 90 tablet 3  . Multiple Vitamins-Minerals (CENTRUM SILVER) tablet Take 1 tablet by mouth daily.      . mupirocin ointment (BACTROBAN) 2 % Apply 1 application topically as needed. Skin irritation    . nitroGLYCERIN (NITROSTAT) 0.4 MG SL tablet Place 1 tablet (0.4 mg total) under the tongue every 5 (five) minutes as needed for chest pain (chest pain). 25 tablet prn  . pravastatin (PRAVACHOL) 40 MG tablet Take 40 mg by mouth daily.    . ramipril (ALTACE) 10 MG tablet Take 10 mg by mouth daily.      Marland Kitchen zolpidem (AMBIEN) 10 MG  tablet Take 10 mg by mouth at bedtime.     No current facility-administered medications for this visit.     Past Medical History:  Diagnosis Date  . Atherosclerotic coronary vascular disease    SINGLE VESSEL OCCULSIVE  . Diabetes mellitus   . Hyperlipidemia   . IHD (ischemic heart disease)   . SOB (shortness of breath) on exertion     Past Surgical History:  Procedure Laterality Date  . CARDIAC CATHETERIZATION  08/03/2002   NORMAL LEFT VENTRICULAR SIZE AND EXCELLENT CONTRACTILITY WITH NO SEGMENTAL WALL ABNORMALITIES. EF 70-75%    Social History   Socioeconomic History  . Marital status: Married    Spouse name: Not on file  . Number of children: Not on file  . Years of education: Not on file  . Highest education level: Not on file  Occupational History  . Not on file  Tobacco Use  . Smoking status: Never Smoker  . Smokeless tobacco: Never Used  Substance and Sexual Activity  . Alcohol use: No  . Drug use: No  . Sexual activity: Not on file  Other Topics Concern  . Not on file  Social History Narrative  . Not on file   Social Determinants of Health   Financial Resource Strain:   . Difficulty of Paying Living Expenses: Not on file  Food Insecurity:   .  Worried About Charity fundraiser in the Last Year: Not on file  . Ran Out of Food in the Last Year: Not on file  Transportation Needs:   . Lack of Transportation (Medical): Not on file  . Lack of Transportation (Non-Medical): Not on file  Physical Activity:   . Days of Exercise per Week: Not on file  . Minutes of Exercise per Session: Not on file  Stress:   . Feeling of Stress : Not on file  Social Connections:   . Frequency of Communication with Friends and Family: Not on file  . Frequency of Social Gatherings with Friends and Family: Not on file  . Attends Religious Services: Not on file  . Active Member of Clubs or Organizations: Not on file  . Attends Archivist Meetings: Not on file  .  Marital Status: Not on file  Intimate Partner Violence:   . Fear of Current or Ex-Partner: Not on file  . Emotionally Abused: Not on file  . Physically Abused: Not on file  . Sexually Abused: Not on file    Family History  Problem Relation Age of Onset  . Arthritis Mother   . Heart disease Father     ROS: no fevers or chills, productive cough, hemoptysis, dysphasia, odynophagia, melena, hematochezia, dysuria, hematuria, rash, seizure activity, orthopnea, PND, pedal edema, claudication. Remaining systems are negative.  Physical Exam: Well-developed well-nourished in no acute distress.  Skin is warm and dry.  HEENT is normal.  Neck is supple.  Chest is clear to auscultation with normal expansion.  Cardiovascular exam is regular rate and rhythm.  Abdominal exam nontender or distended. No masses palpated. Extremities show no edema. neuro grossly intact  ECG-sinus rhythm at a rate of 72, RV conduction delay. Personally reviewed  A/P  1 coronary artery disease-patient describes some increased dyspnea on exertion and chest tightness with more vigorous activities relieved with rest.  We will arrange a stress nuclear study with low threshold for cardiac catheterization as he has a long history of diabetes mellitus.  Continue aspirin and statin.  2 hypertension-blood pressure elevated.  Add amlodipine both as an antihypertensive but also as an antianginal.  Follow blood pressure and advance as needed.  3 hyperlipidemia-continue pravastatin.  He did not tolerate higher doses in the past and therefore I have not changed to Lipitor or Crestor.  Kirk Ruths, MD

## 2019-07-26 NOTE — Patient Instructions (Signed)
Medication Instructions:  START AMLODIPINE 5 MG ONCE DAILY  *If you need a refill on your cardiac medications before your next appointment, please call your pharmacy*  Lab Work: If you have labs (blood work) drawn today and your tests are completely normal, you will receive your results only by: Marland Kitchen MyChart Message (if you have MyChart) OR . A paper copy in the mail If you have any lab test that is abnormal or we need to change your treatment, we will call you to review the results.  Testing/Procedures: Your physician has requested that you have a lexiscan myoview. For further information please visit [email or web address]. Please follow instruction sheet, as given.[place]    Follow-Up: At [facility], you and your health needs are our priority.  As part of our continuing mission to provide you with exceptional heart care, we have created designated Provider Care Teams.  These Care Teams include your primary Cardiologist (physician) and Advanced Practice Providers (APPs -  Physician Assistants and Nurse Practitioners) who all work together to provide you with the care you need, when you need it.  Your next appointment:   6-8 week(s)  The format for your next appointment:   In Person  Provider:   Kirk Ruths, MD

## 2019-07-27 ENCOUNTER — Telehealth (HOSPITAL_COMMUNITY): Payer: Self-pay | Admitting: *Deleted

## 2019-07-27 NOTE — Telephone Encounter (Signed)
Patient called to scheduled stress test Patient given detailed instructions per Myocardial Perfusion Study Information Sheet for the test on 08/03/19. Patient notified to arrive 15 minutes early and that it is imperative to arrive on time for appointment to keep from having the test rescheduled.  If you need to cancel or reschedule your appointment, please call the office within 24 hours of your appointment. . Patient verbalized understanding. Kirstie Peri

## 2019-08-01 ENCOUNTER — Telehealth (HOSPITAL_COMMUNITY): Payer: Self-pay

## 2019-08-01 NOTE — Telephone Encounter (Signed)
Spoke with the patient, instructions given. He stated that he will be here for his test. Asked to call back with any questions. S.Kadejah Sandiford EMTP

## 2019-08-03 ENCOUNTER — Ambulatory Visit (HOSPITAL_COMMUNITY): Payer: Medicare Other | Attending: Internal Medicine

## 2019-08-03 ENCOUNTER — Other Ambulatory Visit: Payer: Self-pay

## 2019-08-03 DIAGNOSIS — I2510 Atherosclerotic heart disease of native coronary artery without angina pectoris: Secondary | ICD-10-CM | POA: Diagnosis not present

## 2019-08-03 LAB — MYOCARDIAL PERFUSION IMAGING
LV dias vol: 79 mL (ref 62–150)
LV sys vol: 29 mL
Peak HR: 90 {beats}/min
Rest HR: 73 {beats}/min
SDS: 5
SRS: 1
SSS: 6
TID: 1.05

## 2019-08-03 MED ORDER — TECHNETIUM TC 99M TETROFOSMIN IV KIT
10.9000 | PACK | Freq: Once | INTRAVENOUS | Status: AC | PRN
  Administered 2019-08-03: 10.9 via INTRAVENOUS
  Filled 2019-08-03: qty 11

## 2019-08-03 MED ORDER — REGADENOSON 0.4 MG/5ML IV SOLN
0.4000 mg | Freq: Once | INTRAVENOUS | Status: AC
  Administered 2019-08-03: 0.4 mg via INTRAVENOUS

## 2019-08-03 MED ORDER — TECHNETIUM TC 99M TETROFOSMIN IV KIT
32.7000 | PACK | Freq: Once | INTRAVENOUS | Status: AC | PRN
  Administered 2019-08-03: 32.7 via INTRAVENOUS
  Filled 2019-08-03: qty 33

## 2019-08-07 ENCOUNTER — Telehealth: Payer: Self-pay | Admitting: Cardiology

## 2019-08-07 NOTE — Telephone Encounter (Signed)
We are recommending the COVID-19 vaccine to all of our patients. Cardiac medications (including blood thinners) should not deter anyone from being vaccinated and there is no need to hold any of those medications prior to vaccine administration.     Currently, there is a hotline to call (active 07/14/19) to schedule vaccination appointments as no walk-ins will be accepted.   Number: [phone].    If an appointment is not available please go to [email or web address] to sign up for notification when additional vaccine appointments are available.   If you have further questions or concerns about the vaccine process, please visit [email or web address] or contact your primary care physician.   I read the above information to the pt and he said he understood

## 2019-08-10 NOTE — H&P (View-Only) (Signed)
HPI: FU coronary artery disease; cath in 2004 showing occlusion of OM 1 treated medically. Echo October 2019 showed normal LV function, trace aortic insufficiency, mild left atrial enlargement.Carotid Dopplers October 2019 showed no hemodynamically significant stenosis. Nuclear study January 2021 showed ejection fraction 64%, small area of apical lateral ischemia.  Since last seen,patient continues to have complaints of increased dyspnea on exertion as well as chest tightness with activities relieved with rest.  He does not have the symptoms at rest.  He denies orthopnea, PND, pedal edema or syncope.  Current Outpatient Medications  Medication Sig Dispense Refill  . amLODipine (NORVASC) 5 MG tablet Take 1 tablet (5 mg total) by mouth daily. 90 tablet 3  . Ascorbic Acid (VITAMIN C) 1000 MG tablet Take 1,000 mg by mouth daily.    Marland Kitchen aspirin EC 81 MG tablet Take 81 mg by mouth daily.    . diazepam (VALIUM) 2 MG tablet Take 2 mg by mouth as needed (dizziness).     . fish oil-omega-3 fatty acids 1000 MG capsule Take 5,000 mg by mouth daily.     . imiquimod (ALDARA) 5 % cream     . insulin lispro (HUMALOG) 100 UNIT/ML injection Inject into the skin every morning. Inject 14-20 units under the skin daily in the morning.    . insulin NPH (HUMULIN N) 100 UNIT/ML injection Inject into the skin daily before breakfast. Inject 26-28 units under the skin daily in the morning    . loratadine (CLARITIN) 10 MG tablet Take 10 mg by mouth as needed.      . metoprolol succinate (TOPROL-XL) 25 MG 24 hr tablet TAKE ONE TABLET BY MOUTH EVERY DAY 90 tablet 3  . Multiple Vitamins-Minerals (CENTRUM SILVER) tablet Take 1 tablet by mouth daily.      . mupirocin ointment (BACTROBAN) 2 % Apply 1 application topically as needed. Skin irritation    . nitroGLYCERIN (NITROSTAT) 0.4 MG SL tablet Place 1 tablet (0.4 mg total) under the tongue every 5 (five) minutes as needed for chest pain (chest pain). 25 tablet prn  .  pravastatin (PRAVACHOL) 40 MG tablet Take 80 mg by mouth daily.     . ramipril (ALTACE) 10 MG tablet Take 10 mg by mouth daily.      . tamsulosin (FLOMAX) 0.4 MG CAPS capsule Take 0.4 mg by mouth daily.    Marland Kitchen zolpidem (AMBIEN) 10 MG tablet Take 10 mg by mouth at bedtime.     No current facility-administered medications for this visit.     Past Medical History:  Diagnosis Date  . Atherosclerotic coronary vascular disease    SINGLE VESSEL OCCULSIVE  . Diabetes mellitus   . Hyperlipidemia   . IHD (ischemic heart disease)   . SOB (shortness of breath) on exertion     Past Surgical History:  Procedure Laterality Date  . CARDIAC CATHETERIZATION  08/03/2002   NORMAL LEFT VENTRICULAR SIZE AND EXCELLENT CONTRACTILITY WITH NO SEGMENTAL WALL ABNORMALITIES. EF 70-75%    Social History   Socioeconomic History  . Marital status: Married    Spouse name: Not on file  . Number of children: Not on file  . Years of education: Not on file  . Highest education level: Not on file  Occupational History  . Not on file  Tobacco Use  . Smoking status: Never Smoker  . Smokeless tobacco: Never Used  Substance and Sexual Activity  . Alcohol use: No  . Drug use: No  . Sexual  activity: Not on file  Other Topics Concern  . Not on file  Social History Narrative  . Not on file   Social Determinants of Health   Financial Resource Strain:   . Difficulty of Paying Living Expenses: Not on file  Food Insecurity:   . Worried About Charity fundraiser in the Last Year: Not on file  . Ran Out of Food in the Last Year: Not on file  Transportation Needs:   . Lack of Transportation (Medical): Not on file  . Lack of Transportation (Non-Medical): Not on file  Physical Activity:   . Days of Exercise per Week: Not on file  . Minutes of Exercise per Session: Not on file  Stress:   . Feeling of Stress : Not on file  Social Connections:   . Frequency of Communication with Friends and Family: Not on file  .  Frequency of Social Gatherings with Friends and Family: Not on file  . Attends Religious Services: Not on file  . Active Member of Clubs or Organizations: Not on file  . Attends Archivist Meetings: Not on file  . Marital Status: Not on file  Intimate Partner Violence:   . Fear of Current or Ex-Partner: Not on file  . Emotionally Abused: Not on file  . Physically Abused: Not on file  . Sexually Abused: Not on file    Family History  Problem Relation Age of Onset  . Arthritis Mother   . Heart disease Father     ROS: no fevers or chills, productive cough, hemoptysis, dysphasia, odynophagia, melena, hematochezia, dysuria, hematuria, rash, seizure activity, orthopnea, PND, pedal edema, claudication. Remaining systems are negative.  Physical Exam: Well-developed well-nourished in no acute distress.  Skin is warm and dry.  HEENT is normal.  Neck is supple.  Chest is clear to auscultation with normal expansion.  Cardiovascular exam is regular rate and rhythm.  Abdominal exam nontender or distended. No masses palpated. Extremities show no edema. neuro grossly intact   A/P  1 abnormal nuclear study/chest pain-patient continues to describe increased dyspnea on exertion and chest tightness with exertion relieved with rest.  His nuclear study shows lateral ischemia.  He also has 57 years of diabetes mellitus.  We will plan definitive evaluation with cardiac catheterization.  The risks and benefits including myocardial infarction, CVA and death discussed and he agrees to proceed.  Continue aspirin and statin.  Increase Toprol to 50 mg daily.  Continue amlodipine.  2 coronary artery disease, continue aspirin and statin.  3 hypertension-patient's blood pressure is mildly elevated.  Increase Toprol as outlined above and follow.  4 hyperlipidemia-continue pravastatin at present dose.  He did not tolerate higher doses previously.  Check lipids and liver.  May need to add Zetia or  Repatha.  Kirk Ruths, MD

## 2019-08-10 NOTE — Progress Notes (Signed)
HPI: FU coronary artery disease; cath in 2004 showing occlusion of OM 1 treated medically. Echo October 2019 showed normal LV function, trace aortic insufficiency, mild left atrial enlargement.Carotid Dopplers October 2019 showed no hemodynamically significant stenosis. Nuclear study January 2021 showed ejection fraction 64%, small area of apical lateral ischemia.  Since last seen,patient continues to have complaints of increased dyspnea on exertion as well as chest tightness with activities relieved with rest.  He does not have the symptoms at rest.  He denies orthopnea, PND, pedal edema or syncope.  Current Outpatient Medications  Medication Sig Dispense Refill  . amLODipine (NORVASC) 5 MG tablet Take 1 tablet (5 mg total) by mouth daily. 90 tablet 3  . Ascorbic Acid (VITAMIN C) 1000 MG tablet Take 1,000 mg by mouth daily.    Marland Kitchen aspirin EC 81 MG tablet Take 81 mg by mouth daily.    . diazepam (VALIUM) 2 MG tablet Take 2 mg by mouth as needed (dizziness).     . fish oil-omega-3 fatty acids 1000 MG capsule Take 5,000 mg by mouth daily.     . imiquimod (ALDARA) 5 % cream     . insulin lispro (HUMALOG) 100 UNIT/ML injection Inject into the skin every morning. Inject 14-20 units under the skin daily in the morning.    . insulin NPH (HUMULIN N) 100 UNIT/ML injection Inject into the skin daily before breakfast. Inject 26-28 units under the skin daily in the morning    . loratadine (CLARITIN) 10 MG tablet Take 10 mg by mouth as needed.      . metoprolol succinate (TOPROL-XL) 25 MG 24 hr tablet TAKE ONE TABLET BY MOUTH EVERY DAY 90 tablet 3  . Multiple Vitamins-Minerals (CENTRUM SILVER) tablet Take 1 tablet by mouth daily.      . mupirocin ointment (BACTROBAN) 2 % Apply 1 application topically as needed. Skin irritation    . nitroGLYCERIN (NITROSTAT) 0.4 MG SL tablet Place 1 tablet (0.4 mg total) under the tongue every 5 (five) minutes as needed for chest pain (chest pain). 25 tablet prn  .  pravastatin (PRAVACHOL) 40 MG tablet Take 80 mg by mouth daily.     . ramipril (ALTACE) 10 MG tablet Take 10 mg by mouth daily.      . tamsulosin (FLOMAX) 0.4 MG CAPS capsule Take 0.4 mg by mouth daily.    Marland Kitchen zolpidem (AMBIEN) 10 MG tablet Take 10 mg by mouth at bedtime.     No current facility-administered medications for this visit.     Past Medical History:  Diagnosis Date  . Atherosclerotic coronary vascular disease    SINGLE VESSEL OCCULSIVE  . Diabetes mellitus   . Hyperlipidemia   . IHD (ischemic heart disease)   . SOB (shortness of breath) on exertion     Past Surgical History:  Procedure Laterality Date  . CARDIAC CATHETERIZATION  08/03/2002   NORMAL LEFT VENTRICULAR SIZE AND EXCELLENT CONTRACTILITY WITH NO SEGMENTAL WALL ABNORMALITIES. EF 70-75%    Social History   Socioeconomic History  . Marital status: Married    Spouse name: Not on file  . Number of children: Not on file  . Years of education: Not on file  . Highest education level: Not on file  Occupational History  . Not on file  Tobacco Use  . Smoking status: Never Smoker  . Smokeless tobacco: Never Used  Substance and Sexual Activity  . Alcohol use: No  . Drug use: No  . Sexual  activity: Not on file  Other Topics Concern  . Not on file  Social History Narrative  . Not on file   Social Determinants of Health   Financial Resource Strain:   . Difficulty of Paying Living Expenses: Not on file  Food Insecurity:   . Worried About Charity fundraiser in the Last Year: Not on file  . Ran Out of Food in the Last Year: Not on file  Transportation Needs:   . Lack of Transportation (Medical): Not on file  . Lack of Transportation (Non-Medical): Not on file  Physical Activity:   . Days of Exercise per Week: Not on file  . Minutes of Exercise per Session: Not on file  Stress:   . Feeling of Stress : Not on file  Social Connections:   . Frequency of Communication with Friends and Family: Not on file  .  Frequency of Social Gatherings with Friends and Family: Not on file  . Attends Religious Services: Not on file  . Active Member of Clubs or Organizations: Not on file  . Attends Archivist Meetings: Not on file  . Marital Status: Not on file  Intimate Partner Violence:   . Fear of Current or Ex-Partner: Not on file  . Emotionally Abused: Not on file  . Physically Abused: Not on file  . Sexually Abused: Not on file    Family History  Problem Relation Age of Onset  . Arthritis Mother   . Heart disease Father     ROS: no fevers or chills, productive cough, hemoptysis, dysphasia, odynophagia, melena, hematochezia, dysuria, hematuria, rash, seizure activity, orthopnea, PND, pedal edema, claudication. Remaining systems are negative.  Physical Exam: Well-developed well-nourished in no acute distress.  Skin is warm and dry.  HEENT is normal.  Neck is supple.  Chest is clear to auscultation with normal expansion.  Cardiovascular exam is regular rate and rhythm.  Abdominal exam nontender or distended. No masses palpated. Extremities show no edema. neuro grossly intact   A/P  1 abnormal nuclear study/chest pain-patient continues to describe increased dyspnea on exertion and chest tightness with exertion relieved with rest.  His nuclear study shows lateral ischemia.  He also has 57 years of diabetes mellitus.  We will plan definitive evaluation with cardiac catheterization.  The risks and benefits including myocardial infarction, CVA and death discussed and he agrees to proceed.  Continue aspirin and statin.  Increase Toprol to 50 mg daily.  Continue amlodipine.  2 coronary artery disease, continue aspirin and statin.  3 hypertension-patient's blood pressure is mildly elevated.  Increase Toprol as outlined above and follow.  4 hyperlipidemia-continue pravastatin at present dose.  He did not tolerate higher doses previously.  Check lipids and liver.  May need to add Zetia or  Repatha.  Kirk Ruths, MD

## 2019-08-16 ENCOUNTER — Other Ambulatory Visit: Payer: Self-pay

## 2019-08-16 ENCOUNTER — Other Ambulatory Visit: Payer: Self-pay | Admitting: *Deleted

## 2019-08-16 ENCOUNTER — Ambulatory Visit (INDEPENDENT_AMBULATORY_CARE_PROVIDER_SITE_OTHER): Payer: Medicare Other | Admitting: Cardiology

## 2019-08-16 ENCOUNTER — Encounter: Payer: Self-pay | Admitting: Cardiology

## 2019-08-16 VITALS — BP 139/64 | HR 76 | Ht 66.0 in | Wt 182.1 lb

## 2019-08-16 DIAGNOSIS — I10 Essential (primary) hypertension: Secondary | ICD-10-CM | POA: Diagnosis not present

## 2019-08-16 DIAGNOSIS — R072 Precordial pain: Secondary | ICD-10-CM

## 2019-08-16 DIAGNOSIS — I2510 Atherosclerotic heart disease of native coronary artery without angina pectoris: Secondary | ICD-10-CM

## 2019-08-16 DIAGNOSIS — R9439 Abnormal result of other cardiovascular function study: Secondary | ICD-10-CM

## 2019-08-16 DIAGNOSIS — E7800 Pure hypercholesterolemia, unspecified: Secondary | ICD-10-CM | POA: Diagnosis not present

## 2019-08-16 MED ORDER — METOPROLOL SUCCINATE ER 50 MG PO TB24: 50.0000 mg | ORAL_TABLET | Freq: Every day | ORAL | 3 refills | Status: DC

## 2019-08-16 MED ORDER — SODIUM CHLORIDE 0.9% FLUSH: 3.0000 mL | Freq: Two times a day (BID) | INTRAVENOUS | Status: DC

## 2019-08-16 NOTE — Patient Instructions (Addendum)
    [facility] [place] [address] Dept: [phone] Loc: [phone]  Tommy Lopez  08/16/2019  You are scheduled for a Cardiac Catheterization on Tuesday, February 23 with Dr. Larae Grooms.  1. Please arrive at the [facility] (Main Entrance A) at [facility]: [address] [place], [place] [address] at 8:30 AM (This time is two hours before your procedure to ensure your preparation). Free valet parking service is available.   Special note: Every effort is made to have your procedure done on time. Please understand that emergencies sometimes delay scheduled procedures.  2. Diet: Do not eat solid foods after midnight.  The patient may have clear liquids until 5am upon the day of the procedure.  3. Labs: You will need to have blood drawn WHEN CONVENIENT  GO TO [address] IN [place] Friday 08/25/19 @ 11:25 AM FOR COVID TESTING-PRE-PROCEDURE LINE  4. Medication instructions in preparation for your procedure:  INCREASE METOPROLOL TO 50 MG ONCE DAILY= 2 OF THE 25 MG TABLETS ONCE DAILY  DO NOT TAKE INSULIN THE MORNING OF THE PROCEDURE  On the morning of your procedure, take your Aspirin and any morning medicines NOT listed above.  You may use sips of water.  5. Plan for one night stay--bring personal belongings. 6. Bring a current list of your medications and current insurance cards. 7. You MUST have a responsible person to drive you home. 8. Someone MUST be with you the first 24 hours after you arrive home or your discharge will be delayed. 9. Please wear clothes that are easy to get on and off and wear slip-on shoes.  Thank you for allowing [place] to care for you!   -- [facility]   Your physician recommends that you schedule a follow-up appointment in: AS SCHEDULED

## 2019-08-17 LAB — CBC
HCT: 40.5 % (ref 38.5–50.0)
Hemoglobin: 13.5 g/dL (ref 13.2–17.1)
MCH: 30.3 pg (ref 27.0–33.0)
MCHC: 33.3 g/dL (ref 32.0–36.0)
MCV: 90.8 fL (ref 80.0–100.0)
MPV: 11.2 fL (ref 7.5–12.5)
Platelets: 162 10*3/uL (ref 140–400)
RBC: 4.46 10*6/uL (ref 4.20–5.80)
RDW: 11.7 % (ref 11.0–15.0)
WBC: 5.5 10*3/uL (ref 3.8–10.8)

## 2019-08-17 LAB — LIPID PANEL
Cholesterol: 111 mg/dL (ref <200)
HDL: 31 mg/dL — ABNORMAL LOW (ref >=40)
LDL Cholesterol (Calc): 64 mg/dL (calc)
Non-HDL Cholesterol (Calc): 80 mg/dL (calc) (ref <130)
Total CHOL/HDL Ratio: 3.6 (calc) (ref <5.0)
Triglycerides: 78 mg/dL (ref <150)

## 2019-08-17 LAB — COMPREHENSIVE METABOLIC PANEL
AG Ratio: 1.5 (calc) (ref 1.0–2.5)
ALT: 16 U/L (ref 9–46)
AST: 17 U/L (ref 10–35)
Albumin: 4.1 g/dL (ref 3.6–5.1)
Alkaline phosphatase (APISO): 62 U/L (ref 35–144)
BUN: 19 mg/dL (ref 7–25)
CO2: 30 mmol/L (ref 20–32)
Calcium: 9.3 mg/dL (ref 8.6–10.3)
Chloride: 104 mmol/L (ref 98–110)
Creat: 0.95 mg/dL (ref 0.70–1.25)
Globulin: 2.8 g/dL (calc) (ref 1.9–3.7)
Glucose, Bld: 297 mg/dL — ABNORMAL HIGH (ref 65–99)
Potassium: 4.4 mmol/L (ref 3.5–5.3)
Sodium: 138 mmol/L (ref 135–146)
Total Bilirubin: 0.5 mg/dL (ref 0.2–1.2)
Total Protein: 6.9 g/dL (ref 6.1–8.1)

## 2019-08-22 ENCOUNTER — Encounter: Payer: Self-pay | Admitting: *Deleted

## 2019-08-23 ENCOUNTER — Telehealth: Payer: Self-pay | Admitting: Cardiology

## 2019-08-23 NOTE — Telephone Encounter (Signed)
Patient called regarding an upcoming procedure - has questions about it.   Thank you!

## 2019-08-23 NOTE — Telephone Encounter (Signed)
Spoke with pt, he is scheduled for procedure 08/29/19 he was asking what to do to reschedule if needed. Explained to call the office and can be rescheduled. All other questions answered.

## 2019-08-25 ENCOUNTER — Other Ambulatory Visit (HOSPITAL_COMMUNITY)
Admission: RE | Admit: 2019-08-25 | Discharge: 2019-08-25 | Disposition: A | Discharge status: Home / Self Care | Payer: Medicare Other | Source: Ambulatory Visit | Attending: Interventional Cardiology | Admitting: Interventional Cardiology

## 2019-08-25 DIAGNOSIS — Z01812 Encounter for preprocedural laboratory examination: Secondary | ICD-10-CM | POA: Insufficient documentation

## 2019-08-25 DIAGNOSIS — Z20822 Contact with and (suspected) exposure to covid-19: Secondary | ICD-10-CM | POA: Insufficient documentation

## 2019-08-25 LAB — SARS CORONAVIRUS 2 (TAT 6-24 HRS): SARS Coronavirus 2: NEGATIVE

## 2019-08-28 ENCOUNTER — Telehealth: Payer: Self-pay | Admitting: *Deleted

## 2019-08-28 NOTE — Telephone Encounter (Addendum)
Pt contacted pre-catheterization scheduled at [facility] for: Tuesday August 29, 2019 10:30 AM Verified arrival time and place: [facility]) at: 8:30 AM   No solid food after midnight prior to cath, clear liquids until 5 AM day of procedure. Contrast allergy: no  Hold: Insulin-AM of procedure. 1/2 usual Insulin dose PM prior to procedure.  Except hold medications AM meds can be  taken pre-cath with sip of water including: ASA 81 mg   Confirmed patient has responsible adult to drive home post procedure and observe 24 hours after arriving home: yes  Currently, due to Covid-19 pandemic, only one person will be allowed with patient. Must be the same person for patient's entire stay and will be required to wear a mask. They will be asked to wait in the waiting room for the duration of the patient's stay.  Patients are required to wear a mask when they enter the hospital.       COVID-19 Pre-Screening Questions:  . In the past 7 to 10 days have you had a cough,  shortness of breath, headache, congestion, fever (100 or greater) body aches, chills, sore throat, or sudden loss of taste or sense of smell? no . Have you been around anyone with known Covid 19 in the past 7-10 days? no . Have you been around anyone who is awaiting Covid 19 test results in the past 7 to 10 days? no . Have you been around anyone who has been exposed to Covid 19, or has mentioned symptoms of Covid 19 within the past 7 to 10 days? no  I reviewed procedure/mask/visitor instructions, COVID-19 screening questions with patient, he verbalized understanding, thanked me for call.  Pt had first COVID-19 vaccine 08/11/19.  Pt is blind.

## 2019-08-29 ENCOUNTER — Encounter (HOSPITAL_COMMUNITY): Payer: Self-pay | Admitting: Interventional Cardiology

## 2019-08-29 ENCOUNTER — Other Ambulatory Visit: Payer: Self-pay

## 2019-08-29 ENCOUNTER — Ambulatory Visit (HOSPITAL_COMMUNITY)
Admission: RE | Admit: 2019-08-29 | Discharge: 2019-08-30 | Disposition: A | Discharge status: Home / Self Care | Payer: Medicare Other | Attending: Interventional Cardiology | Admitting: Interventional Cardiology

## 2019-08-29 ENCOUNTER — Encounter (HOSPITAL_COMMUNITY)
Admission: RE | Disposition: A | Discharge status: Home / Self Care | Payer: Self-pay | Source: Home / Self Care | Attending: Interventional Cardiology

## 2019-08-29 DIAGNOSIS — N400 Enlarged prostate without lower urinary tract symptoms: Secondary | ICD-10-CM | POA: Diagnosis not present

## 2019-08-29 DIAGNOSIS — I25118 Atherosclerotic heart disease of native coronary artery with other forms of angina pectoris: Secondary | ICD-10-CM

## 2019-08-29 DIAGNOSIS — Z7982 Long term (current) use of aspirin: Secondary | ICD-10-CM | POA: Insufficient documentation

## 2019-08-29 DIAGNOSIS — Z955 Presence of coronary angioplasty implant and graft: Secondary | ICD-10-CM | POA: Diagnosis not present

## 2019-08-29 DIAGNOSIS — R0602 Shortness of breath: Secondary | ICD-10-CM | POA: Diagnosis not present

## 2019-08-29 DIAGNOSIS — I25110 Atherosclerotic heart disease of native coronary artery with unstable angina pectoris: Secondary | ICD-10-CM | POA: Insufficient documentation

## 2019-08-29 DIAGNOSIS — Z8249 Family history of ischemic heart disease and other diseases of the circulatory system: Secondary | ICD-10-CM | POA: Diagnosis not present

## 2019-08-29 DIAGNOSIS — Z79899 Other long term (current) drug therapy: Secondary | ICD-10-CM | POA: Insufficient documentation

## 2019-08-29 DIAGNOSIS — I259 Chronic ischemic heart disease, unspecified: Secondary | ICD-10-CM | POA: Insufficient documentation

## 2019-08-29 DIAGNOSIS — Z794 Long term (current) use of insulin: Secondary | ICD-10-CM | POA: Diagnosis not present

## 2019-08-29 DIAGNOSIS — I25119 Atherosclerotic heart disease of native coronary artery with unspecified angina pectoris: Secondary | ICD-10-CM | POA: Diagnosis present

## 2019-08-29 DIAGNOSIS — E7800 Pure hypercholesterolemia, unspecified: Secondary | ICD-10-CM | POA: Diagnosis not present

## 2019-08-29 DIAGNOSIS — I10 Essential (primary) hypertension: Secondary | ICD-10-CM | POA: Insufficient documentation

## 2019-08-29 DIAGNOSIS — E785 Hyperlipidemia, unspecified: Secondary | ICD-10-CM | POA: Insufficient documentation

## 2019-08-29 DIAGNOSIS — R9439 Abnormal result of other cardiovascular function study: Secondary | ICD-10-CM

## 2019-08-29 DIAGNOSIS — E119 Type 2 diabetes mellitus without complications: Secondary | ICD-10-CM | POA: Insufficient documentation

## 2019-08-29 DIAGNOSIS — I2510 Atherosclerotic heart disease of native coronary artery without angina pectoris: Secondary | ICD-10-CM | POA: Diagnosis present

## 2019-08-29 HISTORY — PX: CORONARY STENT INTERVENTION: CATH118234

## 2019-08-29 HISTORY — DX: Essential (primary) hypertension: I10

## 2019-08-29 HISTORY — PX: CARDIAC CATHETERIZATION: SHX172

## 2019-08-29 HISTORY — PX: CORONARY STENT PLACEMENT: SHX1402

## 2019-08-29 HISTORY — PX: LEFT HEART CATH AND CORONARY ANGIOGRAPHY: CATH118249

## 2019-08-29 HISTORY — PX: CORONARY ULTRASOUND/IVUS: CATH118244

## 2019-08-29 LAB — GLUCOSE, CAPILLARY
Glucose-Capillary: 240 mg/dL — ABNORMAL HIGH (ref 70–99)
Glucose-Capillary: 244 mg/dL — ABNORMAL HIGH (ref 70–99)
Glucose-Capillary: 295 mg/dL — ABNORMAL HIGH (ref 70–99)
Glucose-Capillary: 144 mg/dL — ABNORMAL HIGH (ref 70–99)

## 2019-08-29 LAB — POCT ACTIVATED CLOTTING TIME
Activated Clotting Time: 257 seconds
Activated Clotting Time: 345 seconds

## 2019-08-29 LAB — HEMOGLOBIN A1C
Hgb A1c MFr Bld: 7.7 % — ABNORMAL HIGH (ref 4.8–5.6)
Mean Plasma Glucose: 174.29 mg/dL

## 2019-08-29 SURGERY — LEFT HEART CATH AND CORONARY ANGIOGRAPHY
Anesthesia: LOCAL

## 2019-08-29 MED ORDER — SODIUM CHLORIDE 0.9% FLUSH: 3.0000 mL | INTRAVENOUS | Status: DC | PRN

## 2019-08-29 MED ORDER — ADULT MULTIVITAMIN W/MINERALS CH
1.0000 | ORAL_TABLET | Freq: Every day | ORAL | Status: DC
  Administered 2019-08-30: 09:00:00 1 via ORAL
  Filled 2019-08-29: qty 1

## 2019-08-29 MED ORDER — CLOPIDOGREL BISULFATE 75 MG PO TABS
75.0000 mg | ORAL_TABLET | Freq: Every day | ORAL | Status: DC
  Administered 2019-08-30: 75 mg via ORAL
  Filled 2019-08-29: qty 1

## 2019-08-29 MED ORDER — HEPARIN SODIUM (PORCINE) 1000 UNIT/ML IJ SOLN
INTRAMUSCULAR | Status: AC
  Filled 2019-08-29: qty 1

## 2019-08-29 MED ORDER — OMEGA-3 FATTY ACIDS 1000 MG PO CAPS: 1200.0000 mg | ORAL_CAPSULE | Freq: Four times a day (QID) | ORAL | Status: DC

## 2019-08-29 MED ORDER — PRAVASTATIN SODIUM 40 MG PO TABS
40.0000 mg | ORAL_TABLET | Freq: Every day | ORAL | Status: DC
  Administered 2019-08-30: 40 mg via ORAL
  Filled 2019-08-29: qty 1

## 2019-08-29 MED ORDER — NITROGLYCERIN 0.4 MG SL SUBL: 0.4000 mg | SUBLINGUAL_TABLET | SUBLINGUAL | Status: DC | PRN

## 2019-08-29 MED ORDER — ASCORBIC ACID 500 MG PO TABS
1000.0000 mg | ORAL_TABLET | Freq: Every day | ORAL | Status: DC
  Administered 2019-08-30: 1000 mg via ORAL
  Filled 2019-08-29: qty 2

## 2019-08-29 MED ORDER — LABETALOL HCL 5 MG/ML IV SOLN: 10.0000 mg | INTRAVENOUS | Status: AC | PRN

## 2019-08-29 MED ORDER — RAMIPRIL 5 MG PO CAPS
5.0000 mg | ORAL_CAPSULE | Freq: Every day | ORAL | Status: DC
  Administered 2019-08-29: 5 mg via ORAL
  Filled 2019-08-29 (×2): qty 1

## 2019-08-29 MED ORDER — INSULIN ASPART 100 UNIT/ML ~~LOC~~ SOLN: 0.0000 [IU] | Freq: Every day | SUBCUTANEOUS | Status: DC

## 2019-08-29 MED ORDER — INSULIN LISPRO 100 UNIT/ML ~~LOC~~ SOLN: 8.0000 [IU] | SUBCUTANEOUS | Status: DC

## 2019-08-29 MED ORDER — MIDAZOLAM HCL 2 MG/2ML IJ SOLN
INTRAMUSCULAR | Status: AC
  Filled 2019-08-29: qty 2

## 2019-08-29 MED ORDER — SODIUM CHLORIDE 0.9% FLUSH
3.0000 mL | Freq: Two times a day (BID) | INTRAVENOUS | Status: DC
  Administered 2019-08-29: 22:00:00 3 mL via INTRAVENOUS

## 2019-08-29 MED ORDER — CLOPIDOGREL BISULFATE 300 MG PO TABS
ORAL_TABLET | ORAL | Status: DC | PRN
  Administered 2019-08-29: 600 mg via ORAL

## 2019-08-29 MED ORDER — FENTANYL CITRATE (PF) 100 MCG/2ML IJ SOLN
INTRAMUSCULAR | Status: AC
  Filled 2019-08-29: qty 2

## 2019-08-29 MED ORDER — ASPIRIN 81 MG PO CHEW: 81.0000 mg | CHEWABLE_TABLET | ORAL | Status: DC

## 2019-08-29 MED ORDER — HYDRALAZINE HCL 20 MG/ML IJ SOLN: 10.0000 mg | INTRAMUSCULAR | Status: AC | PRN

## 2019-08-29 MED ORDER — SODIUM CHLORIDE 0.9 % IV SOLN: INTRAVENOUS | Status: AC

## 2019-08-29 MED ORDER — INSULIN ASPART 100 UNIT/ML ~~LOC~~ SOLN
0.0000 [IU] | Freq: Three times a day (TID) | SUBCUTANEOUS | Status: DC
  Administered 2019-08-29: 17:00:00 8 [IU] via SUBCUTANEOUS
  Administered 2019-08-29: 5 [IU] via SUBCUTANEOUS

## 2019-08-29 MED ORDER — SODIUM CHLORIDE 0.9 % IV SOLN
250.0000 mL | INTRAVENOUS | Status: DC | PRN
  Administered 2019-08-29: 17:00:00 250 mL via INTRAVENOUS

## 2019-08-29 MED ORDER — INSULIN NPH (HUMAN) (ISOPHANE) 100 UNIT/ML ~~LOC~~ SUSP
16.0000 [IU] | Freq: Every day | SUBCUTANEOUS | Status: DC
  Administered 2019-08-29: 8 [IU] via SUBCUTANEOUS
  Filled 2019-08-29: qty 10

## 2019-08-29 MED ORDER — INSULIN NPH (HUMAN) (ISOPHANE) 100 UNIT/ML ~~LOC~~ SUSP
12.0000 [IU] | Freq: Every day | SUBCUTANEOUS | Status: DC
  Administered 2019-08-30: 12 [IU] via SUBCUTANEOUS
  Filled 2019-08-29: qty 10

## 2019-08-29 MED ORDER — LORATADINE 10 MG PO TABS
10.0000 mg | ORAL_TABLET | Freq: Every day | ORAL | Status: DC
  Administered 2019-08-30: 09:00:00 10 mg via ORAL
  Filled 2019-08-29: qty 1

## 2019-08-29 MED ORDER — INSULIN ASPART 100 UNIT/ML ~~LOC~~ SOLN
SUBCUTANEOUS | Status: AC
  Filled 2019-08-29: qty 1

## 2019-08-29 MED ORDER — IOHEXOL 350 MG/ML SOLN
INTRAVENOUS | Status: DC | PRN
  Administered 2019-08-29: 130 mL

## 2019-08-29 MED ORDER — MIDAZOLAM HCL 2 MG/2ML IJ SOLN
INTRAMUSCULAR | Status: DC | PRN
  Administered 2019-08-29 (×2): 1 mg via INTRAVENOUS
  Administered 2019-08-29: 2 mg via INTRAVENOUS

## 2019-08-29 MED ORDER — SODIUM CHLORIDE 0.9 % WEIGHT BASED INFUSION: 1.0000 mL/kg/h | INTRAVENOUS | Status: DC

## 2019-08-29 MED ORDER — TAMSULOSIN HCL 0.4 MG PO CAPS
0.4000 mg | ORAL_CAPSULE | Freq: Every day | ORAL | Status: DC
  Administered 2019-08-29: 22:00:00 0.4 mg via ORAL
  Filled 2019-08-29: qty 1

## 2019-08-29 MED ORDER — CLOPIDOGREL BISULFATE 300 MG PO TABS
ORAL_TABLET | ORAL | Status: AC
  Filled 2019-08-29: qty 2

## 2019-08-29 MED ORDER — ONDANSETRON HCL 4 MG/2ML IJ SOLN: 4.0000 mg | Freq: Four times a day (QID) | INTRAMUSCULAR | Status: DC | PRN

## 2019-08-29 MED ORDER — ACETAMINOPHEN 325 MG PO TABS: 650.0000 mg | ORAL_TABLET | ORAL | Status: DC | PRN

## 2019-08-29 MED ORDER — SODIUM CHLORIDE 0.9 % WEIGHT BASED INFUSION: 3.0000 mL/kg/h | INTRAVENOUS | Status: DC

## 2019-08-29 MED ORDER — DIAZEPAM 2 MG PO TABS: 2.0000 mg | ORAL_TABLET | Freq: Every day | ORAL | Status: DC | PRN

## 2019-08-29 MED ORDER — SODIUM CHLORIDE 0.9 % IV SOLN: 250.0000 mL | INTRAVENOUS | Status: DC | PRN

## 2019-08-29 MED ORDER — HEPARIN (PORCINE) IN NACL 1000-0.9 UT/500ML-% IV SOLN
INTRAVENOUS | Status: DC | PRN
  Administered 2019-08-29 (×2): 500 mL

## 2019-08-29 MED ORDER — VERAPAMIL HCL 2.5 MG/ML IV SOLN
INTRAVENOUS | Status: DC | PRN
  Administered 2019-08-29: 10 mL via INTRA_ARTERIAL

## 2019-08-29 MED ORDER — METOPROLOL SUCCINATE ER 50 MG PO TB24
50.0000 mg | ORAL_TABLET | Freq: Every day | ORAL | Status: DC
  Administered 2019-08-29 – 2019-08-30 (×2): 50 mg via ORAL
  Filled 2019-08-29 (×2): qty 1

## 2019-08-29 MED ORDER — AMLODIPINE BESYLATE 5 MG PO TABS
5.0000 mg | ORAL_TABLET | Freq: Every day | ORAL | Status: DC
  Administered 2019-08-29 – 2019-08-30 (×2): 5 mg via ORAL
  Filled 2019-08-29 (×2): qty 1

## 2019-08-29 MED ORDER — MUPIROCIN 2 % EX OINT: 1.0000 "application " | TOPICAL_OINTMENT | Freq: Every day | CUTANEOUS | Status: DC | PRN

## 2019-08-29 MED ORDER — VERAPAMIL HCL 2.5 MG/ML IV SOLN
INTRAVENOUS | Status: AC
  Filled 2019-08-29: qty 2

## 2019-08-29 MED ORDER — ASPIRIN 81 MG PO CHEW: 81.0000 mg | CHEWABLE_TABLET | Freq: Every day | ORAL | Status: DC

## 2019-08-29 MED ORDER — LIDOCAINE HCL (PF) 1 % IJ SOLN
INTRAMUSCULAR | Status: AC
  Filled 2019-08-29: qty 30

## 2019-08-29 MED ORDER — FENTANYL CITRATE (PF) 100 MCG/2ML IJ SOLN
INTRAMUSCULAR | Status: DC | PRN
  Administered 2019-08-29 (×4): 25 ug via INTRAVENOUS

## 2019-08-29 MED ORDER — LIDOCAINE HCL (PF) 1 % IJ SOLN
INTRAMUSCULAR | Status: DC | PRN
  Administered 2019-08-29: 2 mL

## 2019-08-29 MED ORDER — ASPIRIN EC 81 MG PO TBEC
81.0000 mg | DELAYED_RELEASE_TABLET | Freq: Every day | ORAL | Status: DC
  Administered 2019-08-30: 81 mg via ORAL
  Filled 2019-08-29: qty 1

## 2019-08-29 MED ORDER — ZOLPIDEM TARTRATE 5 MG PO TABS
5.0000 mg | ORAL_TABLET | Freq: Every day | ORAL | Status: DC
  Administered 2019-08-29: 22:00:00 5 mg via ORAL
  Filled 2019-08-29: qty 1

## 2019-08-29 MED ORDER — OMEGA-3-ACID ETHYL ESTERS 1 G PO CAPS
1.0000 g | ORAL_CAPSULE | Freq: Four times a day (QID) | ORAL | Status: DC
  Administered 2019-08-29 – 2019-08-30 (×3): 1 g via ORAL
  Filled 2019-08-29 (×3): qty 1

## 2019-08-29 MED ORDER — HEPARIN SODIUM (PORCINE) 1000 UNIT/ML IJ SOLN
INTRAMUSCULAR | Status: DC | PRN
  Administered 2019-08-29: 4000 [IU] via INTRAVENOUS
  Administered 2019-08-29: 6000 [IU] via INTRAVENOUS
  Administered 2019-08-29: 4000 [IU] via INTRAVENOUS

## 2019-08-29 MED ORDER — SODIUM CHLORIDE 0.9% FLUSH: 3.0000 mL | Freq: Two times a day (BID) | INTRAVENOUS | Status: DC

## 2019-08-29 MED ORDER — INSULIN ASPART 100 UNIT/ML ~~LOC~~ SOLN: 0.0000 [IU] | Freq: Three times a day (TID) | SUBCUTANEOUS | Status: DC

## 2019-08-29 MED ORDER — IMIQUIMOD 5 % EX CREA: 1.0000 "application " | TOPICAL_CREAM | Freq: Every day | CUTANEOUS | Status: DC | PRN

## 2019-08-29 SURGICAL SUPPLY — 22 items
BALLN WOLVERINE 2.50X10 (BALLOONS) ×2
BALLN ~~LOC~~ EMERGE MR 3.5X12 (BALLOONS) ×2
BALLOON WOLVERINE 2.50X10 (BALLOONS) IMPLANT
BALLOON ~~LOC~~ EMERGE MR 3.5X12 (BALLOONS) IMPLANT
CATH 5FR JL3.5 JR4 ANG PIG MP (CATHETERS) ×1 IMPLANT
CATH LAUNCHER 6FR EBU3.5 (CATHETERS) ×1 IMPLANT
CATH OPTICROSS HD (CATHETERS) ×1 IMPLANT
DEVICE RAD COMP TR BAND LRG (VASCULAR PRODUCTS) ×1 IMPLANT
GLIDESHEATH SLEND SS 6F .021 (SHEATH) ×1 IMPLANT
GUIDEWIRE INQWIRE 1.5J.035X260 (WIRE) IMPLANT
INQWIRE 1.5J .035X260CM (WIRE) ×2
KIT ENCORE 26 ADVANTAGE (KITS) ×1 IMPLANT
KIT HEART LEFT (KITS) ×2 IMPLANT
KIT HEMO VALVE WATCHDOG (MISCELLANEOUS) ×1 IMPLANT
PACK CARDIAC CATHETERIZATION (CUSTOM PROCEDURE TRAY) ×2 IMPLANT
SLED PULL BACK IVUS (MISCELLANEOUS) ×1 IMPLANT
STENT SYNERGY XD 3.0X20 (Permanent Stent) IMPLANT
SYNERGY XD 3.0X20 (Permanent Stent) ×2 IMPLANT
TRANSDUCER W/STOPCOCK (MISCELLANEOUS) ×2 IMPLANT
TUBING CIL FLEX 10 FLL-RA (TUBING) ×2 IMPLANT
WIRE ASAHI PROWATER 180CM (WIRE) ×1 IMPLANT
WIRE HI TORQ BMW 190CM (WIRE) ×1 IMPLANT

## 2019-08-29 NOTE — Interval H&P Note (Signed)
Cath Lab Visit (complete for each Cath Lab visit)  Clinical Evaluation Leading to the Procedure:   ACS: No.  Non-ACS:    Anginal Classification: CCS III  Anti-ischemic medical therapy: Minimal Therapy (1 class of medications)  Non-Invasive Test Results: Intermediate-risk stress test findings: cardiac mortality 1-3%/year  Prior CABG: No previous CABG      History and Physical Interval Note:  08/29/2019 10:41 AM  Tommy Lopez  has presented today for surgery, with the diagnosis of cp abn stress test.  The various methods of treatment have been discussed with the patient and family. After consideration of risks, benefits and other options for treatment, the patient has consented to  Procedure(s): LEFT HEART CATH AND CORONARY ANGIOGRAPHY (N/A) as a surgical intervention.  The patient's history has been reviewed, patient examined, no change in status, stable for surgery.  I have reviewed the patient's chart and labs.  Questions were answered to the patient's satisfaction.     Larae Grooms

## 2019-08-30 DIAGNOSIS — I10 Essential (primary) hypertension: Secondary | ICD-10-CM

## 2019-08-30 DIAGNOSIS — I25119 Atherosclerotic heart disease of native coronary artery with unspecified angina pectoris: Secondary | ICD-10-CM | POA: Diagnosis not present

## 2019-08-30 DIAGNOSIS — R9439 Abnormal result of other cardiovascular function study: Secondary | ICD-10-CM | POA: Diagnosis not present

## 2019-08-30 DIAGNOSIS — Z955 Presence of coronary angioplasty implant and graft: Secondary | ICD-10-CM

## 2019-08-30 DIAGNOSIS — E785 Hyperlipidemia, unspecified: Secondary | ICD-10-CM | POA: Diagnosis not present

## 2019-08-30 DIAGNOSIS — I25110 Atherosclerotic heart disease of native coronary artery with unstable angina pectoris: Secondary | ICD-10-CM | POA: Diagnosis not present

## 2019-08-30 DIAGNOSIS — E7800 Pure hypercholesterolemia, unspecified: Secondary | ICD-10-CM

## 2019-08-30 DIAGNOSIS — Z7982 Long term (current) use of aspirin: Secondary | ICD-10-CM | POA: Diagnosis not present

## 2019-08-30 LAB — CBC
HCT: 40 % (ref 39.0–52.0)
Hemoglobin: 13.2 g/dL (ref 13.0–17.0)
MCH: 29.8 pg (ref 26.0–34.0)
MCHC: 33 g/dL (ref 30.0–36.0)
MCV: 90.3 fL (ref 80.0–100.0)
Platelets: 164 10*3/uL (ref 150–400)
RBC: 4.43 MIL/uL (ref 4.22–5.81)
RDW: 12 % (ref 11.5–15.5)
WBC: 7.4 10*3/uL (ref 4.0–10.5)
nRBC: 0 % (ref 0.0–0.2)

## 2019-08-30 LAB — BASIC METABOLIC PANEL
Anion gap: 7 (ref 5–15)
BUN: 16 mg/dL (ref 8–23)
CO2: 28 mmol/L (ref 22–32)
Calcium: 9 mg/dL (ref 8.9–10.3)
Chloride: 106 mmol/L (ref 98–111)
Creatinine, Ser: 0.96 mg/dL (ref 0.61–1.24)
GFR calc Af Amer: >60 mL/min (ref >60)
GFR calc non Af Amer: >60 mL/min (ref >60)
Glucose, Bld: 148 mg/dL — ABNORMAL HIGH (ref 70–99)
Potassium: 4.2 mmol/L (ref 3.5–5.1)
Sodium: 141 mmol/L (ref 135–145)

## 2019-08-30 LAB — GLUCOSE, CAPILLARY
Glucose-Capillary: 88 mg/dL (ref 70–99)
Glucose-Capillary: 279 mg/dL — ABNORMAL HIGH (ref 70–99)

## 2019-08-30 MED ORDER — CLOPIDOGREL BISULFATE 75 MG PO TABS: 75.0000 mg | ORAL_TABLET | Freq: Every day | ORAL | 3 refills | Status: DC

## 2019-08-30 MED ORDER — ATORVASTATIN CALCIUM 80 MG PO TABS: 80.0000 mg | ORAL_TABLET | Freq: Every day | ORAL | Status: DC

## 2019-08-30 MED ORDER — ATORVASTATIN CALCIUM 80 MG PO TABS: 80.0000 mg | ORAL_TABLET | Freq: Every day | ORAL | 3 refills | Status: DC

## 2019-08-30 MED FILL — CLOPIDOGREL 75 MG TABLET: 75 | 90 days supply | Qty: 90 | Fill #0

## 2019-08-30 MED FILL — ATORVASTATIN CALCIUM 80 MG: 80 | 90 days supply | Qty: 90 | Fill #0

## 2019-08-30 NOTE — Discharge Instructions (Signed)

## 2019-08-30 NOTE — Discharge Summary (Addendum)
Discharge Summary    Patient ID: Tommy Lopez MRN: [id number]; DOB: 09/06/1950  Admit date: 08/29/2019 Discharge date: 08/30/2019  Primary Care Provider: Reynold Bowen, MD  Primary Cardiologist: Kirk Ruths, MD  Primary Electrophysiologist:  None   Discharge Diagnoses    Principal Problem:   CAD (coronary artery disease), native coronary artery Active Problems:   Diabetes mellitus ([place])   Hypercholesterolemia   Essential hypertension   Abnormal nuclear stress test    Diagnostic Studies/Procedures    Left heart catheterization 08/29/19:  Prox Cx to Mid Cx lesion is 75% stenosed. Moderate calcium by IVUS.  A drug-eluting stent was successfully placed using a SYNERGY XD 3.0X20. Stent optimized with IVUS and postdilated with a 3.5 mm.  Post intervention, there is a 0% residual stenosis.  2nd Diag lesion is 60% stenosed.  2nd Mrg lesion is 100% stenosed. Left to left collaterals.  The left ventricular systolic function is normal.  The left ventricular ejection fraction is 55-65% by visual estimate.  LV end diastolic pressure is normal.  There is no aortic valve stenosis.   Continue dual antiplatelet for at least 6 months and consider clopidogrel monotherapy after that given CTO of large OM.  Continue aggressive secondary prevention.   _____________   History of Present Illness     Tommy Lopez is a 69 y.o. male with a PMH of CAD with CTO of OM2 (medically managed), HTN, HLD, DM type 2, and BPH. He was seen by Dr. Stanford Breed 08/16/19 for follow-up of his recent NST 07/2019 to evaluated progressive DOE and exertional chest pain. NST revealed a small defect in the mid-apical lateral wall. He was recommended to undergo a LHC to further evaluate his symptoms, prompting this admission.  Hospital Course     Consultants: None   1. Unstable angina in patient with CAD with known CTO of OM2: patient presented for [facility] after outpatient stress test c/f ischemia. [place] 08/29/19  revealed 75% p-mLCx stenosis managed with PCI/DES, as well as 60% 2nd diagonal lesion, and 100% OM2 lesion with left to left collaterals (medically managed). EF estimated to be 55-65% and no AS on LHC. He was recommended for DAPT with aspirin and plavix for a minimum of 6 months. Cr stable post cath. EKG on morning of discharge was non-ischemic. Patient's symptoms improved. Right radial cath site C/D/I, mild ecchymosis, no hematoma. He is blind and we discussed plans to have a close friend lay eyes on his cath site a couple times over the next week to monitor for evidence of infection.  - Continue aspirin and plavix - Transitioned from pravastatin to atorvastatin for aggressive cholesterol management.  - Continue metoprolol.  2. HTN: BP stable this admission - Continue metoprolol, amlodipine, and ramipril  3. HLD: LDL 64 08/17/19. Given CAD noted above, patient was transition from pravastatin to atorvastatin for aggressive cholesterol management - Continue atorvastatin and omega3  4. DM type 2: A1C 7.3 08/29/19; goal <7 - Continue insulin per PCP.   Did the patient have an acute coronary syndrome (MI, NSTEMI, STEMI, etc) this admission?:  No                               Did the patient have a percutaneous coronary intervention (stent / angioplasty)?:  Yes.     Cath/PCI Registry Performance & Quality Measures: 1. Aspirin prescribed? - Yes 2. ADP Receptor Inhibitor (Plavix/Clopidogrel, Brilinta/Ticagrelor or Effient/Prasugrel) prescribed (includes medically managed patients)? -  Yes 3. High Intensity Statin (Lipitor 40-80mg  or Crestor 20-40mg ) prescribed? - Yes 4. For EF <40%, was ACEI/ARB prescribed? - Not Applicable (EF >/= [id number]) 5. For EF <40%, Aldosterone Antagonist (Spironolactone or Eplerenone) prescribed? - Not Applicable (EF >/= [id number]) 6. Cardiac Rehab Phase II ordered (Included Medically managed Patients)? - Yes   _____________  Discharge Vitals Blood pressure 123/61, pulse 62,  temperature 98 F (36.7 C), temperature source Oral, resp. rate 13, height 5' 5.98" (1.676 m), weight 77.1 kg, SpO2 98 %.  Filed Weights   08/29/19 0924 08/29/19 1440 08/30/19 0500  Weight: 80.7 kg 80.6 kg 77.1 kg    Labs & Radiologic Studies    CBC Recent Labs    08/30/19 0246  WBC 7.4  HGB 13.2  HCT 40.0  MCV 90.3  PLT 123456   Basic Metabolic Panel Recent Labs    08/30/19 0246  NA 141  K 4.2  CL 106  CO2 28  GLUCOSE 148*  BUN 16  CREATININE 0.96  CALCIUM 9.0   Liver Function Tests No results for input(s): AST, ALT, ALKPHOS, BILITOT, PROT, ALBUMIN in the last 72 hours. No results for input(s): LIPASE, AMYLASE in the last 72 hours. High Sensitivity Troponin:   No results for input(s): TROPONINIHS in the last 720 hours.  BNP Invalid input(s): POCBNP D-Dimer No results for input(s): DDIMER in the last 72 hours. Hemoglobin A1C Recent Labs    08/29/19 1451  HGBA1C 7.7*   Fasting Lipid Panel No results for input(s): CHOL, HDL, LDLCALC, TRIG, CHOLHDL, LDLDIRECT in the last 72 hours. Thyroid Function Tests No results for input(s): TSH, T4TOTAL, T3FREE, THYROIDAB in the last 72 hours.  Invalid input(s): FREET3 _____________  CARDIAC CATHETERIZATION  Result Date: 08/29/2019  Prox Cx to Mid Cx lesion is 75% stenosed. Moderate calcium by IVUS.  A drug-eluting stent was successfully placed using a SYNERGY XD 3.0X20. Stent optimized with IVUS and postdilated with a 3.5 mm.  Post intervention, there is a 0% residual stenosis.  2nd Diag lesion is 60% stenosed.  2nd Mrg lesion is 100% stenosed. Left to left collaterals.  The left ventricular systolic function is normal.  The left ventricular ejection fraction is 55-65% by visual estimate.  LV end diastolic pressure is normal.  There is no aortic valve stenosis.  Continue dual antiplatelet for at least 6 months and consider clopidogrel monotherapy after that given CTO of large OM.  Continue aggressive secondary  prevention.    MYOCARDIAL PERFUSION IMAGING  Result Date: 08/03/2019  The left ventricular ejection fraction is normal (55-65%).  Nuclear stress EF: 64%.  There was no ST segment deviation noted during stress.  No T wave inversion was noted during stress.  Defect 1: There is a small defect of mild severity present in the mid inferolateral and apical lateral location.  This is a low risk study.  Findings consistent with ischemia.  Low risk stress nuclear study with a small area of mild mid-apical lateral wall ischemia, probably corresponding to an oblique marginal artery distribution. Normal left ventricular regional and global systolic function.  Disposition   Pt is being discharged home today in good condition.  Follow-up Plans & Appointments    Follow-up Information    Lelon Perla, MD Follow up on 09/27/2019.   Specialty: Cardiology Why: Please arrive 15 minutes early for your 2pm post-hospital cardiology follow-up appointment Contact information: [address] [place] [address] [phone]  Discharge Instructions    AMB Referral to Cardiac Rehabilitation - Phase II   Complete by: As directed    Diagnosis: Coronary Stents   After initial evaluation and assessments completed: Virtual Based Care may be provided alone or in conjunction with Phase 2 Cardiac Rehab based on patient barriers.: Yes      Discharge Medications   Allergies as of 08/30/2019   No Known Allergies     Medication List    STOP taking these medications   pravastatin 40 MG tablet Commonly known as: PRAVACHOL     TAKE these medications   amLODipine 5 MG tablet Commonly known as: NORVASC Take 1 tablet (5 mg total) by mouth daily.   aspirin EC 81 MG tablet Take 81 mg by mouth daily.   atorvastatin 80 MG tablet Commonly known as: LIPITOR Take 1 tablet (80 mg total) by mouth daily at 6 PM.   Centrum Silver tablet Take 1 tablet by mouth daily.     clopidogrel 75 MG tablet Commonly known as: PLAVIX Take 1 tablet (75 mg total) by mouth daily with breakfast.   diazepam 2 MG tablet Commonly known as: VALIUM Take 2 mg by mouth daily as needed (dizziness).   fish oil-omega-3 fatty acids 1000 MG capsule Take 1,200 mg by mouth 4 (four) times daily.   HumuLIN N 100 UNIT/ML injection Generic drug: insulin NPH Human Inject 12-18 Units into the skin See admin instructions. Take 12-18 units in the morning based on blood glucose Take 16-20 units in the evening based on blood glucose Mix with Humalog   imiquimod 5 % cream Commonly known as: ALDARA Apply 1 application topically daily as needed (Joint pain).   insulin lispro 100 UNIT/ML injection Commonly known as: HUMALOG Inject 8-14 Units into the skin See admin instructions. Take 8-14 units in the morning based on blood glucose Take 12-18 in the evening Based on blood glucose Mix with Humulin N   loratadine 10 MG tablet Commonly known as: CLARITIN Take 10 mg by mouth daily.   metoprolol succinate 50 MG 24 hr tablet Commonly known as: TOPROL-XL Take 1 tablet (50 mg total) by mouth daily.   mupirocin ointment 2 % Commonly known as: BACTROBAN Apply 1 application topically daily as needed (Skin irritation).   nitroGLYCERIN 0.4 MG SL tablet Commonly known as: NITROSTAT Place 1 tablet (0.4 mg total) under the tongue every 5 (five) minutes as needed for chest pain (chest pain).   OVER THE COUNTER MEDICATION Take 1 capsule by mouth 2 (two) times daily. oil of oregano   ramipril 5 MG capsule Commonly known as: ALTACE Take 5 mg by mouth daily.   tamsulosin 0.4 MG Caps capsule Commonly known as: FLOMAX Take 0.4 mg by mouth at bedtime.   vitamin C 1000 MG tablet Take 1,000 mg by mouth daily.   zolpidem 10 MG tablet Commonly known as: AMBIEN Take 10 mg by mouth at bedtime.          Outstanding Labs/Studies   None  Duration of Discharge Encounter   Greater than 30  minutes including physician time.  Signed, Abigail Butts, PA-C 08/30/2019, 9:32 AM   Patient seen and examined.  Agree with above documentation.  Mr. Matura is a 69 year old male with history of CAD (known CTO of OM 2), type 2 diabetes, hypertension, hyperlipidemia who presented with chest pain.  He underwent left heart cath yesterday, which showed severe proximal to mid left circumflex lesion, status post successful drug-eluting stent placement.  He reports feeling improved this morning.  Denies any chest pain.  On exam, patient is alert and oriented, regular rate and rhythm, no murmurs, lungs CTAB, no LE edema or JVD.  Will discharge on aspirin, Plavix, atorvastatin, metoprolol.  Follow-up scheduled with Dr. Stanford Breed.  Donato Heinz, MD

## 2019-08-30 NOTE — Progress Notes (Signed)
Pt's stable, dc home via wheelchair with TOC meds.

## 2019-08-30 NOTE — Progress Notes (Addendum)
CARDIAC REHAB PHASE I   PRE:  Rate/Rhythm: 72 SR  BP:  Supine:   Sitting: 147/66  Standing:    SaO2: 97%RA  MODE:  Ambulation: 470 ft   POST:  Rate/Rhythm: 85 SR  BP:  Supine:   Sitting: 129/63  Standing:    SaO2: 97%RA 0840-1007 Pt held to my arm and walked 470 ft on RA with steady gait. Has not been up walking in a couple of days so he felt a little weak. No CP. Discussed with pt the importance of plavix with stent. Reviewed NTG use, walking for ex, diabetic and heart healthy food choices, and CRP 2. Referred to [place] CRP 2. Pt stated he would have to get someone to bring him to program as he is blind. He is interested in Virtual but does not have smart phone or tablet for program. He has computer that he stated has [employer] and he would like to talk with someone just to make sure. He is very active and walks miles at a time with his guide dog.    Graylon Good, RN BSN  08/30/2019 9:57 AM

## 2019-08-31 ENCOUNTER — Other Ambulatory Visit: Payer: Self-pay | Admitting: Cardiology

## 2019-09-04 ENCOUNTER — Telehealth (HOSPITAL_COMMUNITY): Payer: Self-pay

## 2019-09-04 ENCOUNTER — Encounter (HOSPITAL_COMMUNITY): Payer: Self-pay | Admitting: *Deleted

## 2019-09-04 NOTE — Progress Notes (Signed)
Referral received and verified for MD signature.  Follow up appt is 3/24. Insurance benefits and eligibility to be determined. Pt will be contacted to discuss both the virtual and onsite cardiac rehab program and his ability to actively participate upon the satisfactory completion of his follow up appt. Cherre Huger, BSN Cardiac and Training and development officer

## 2019-09-04 NOTE — Telephone Encounter (Signed)
Pt insurance is active and benefits verified through [facility] Medicare Co-pay 0, DED 0/0 met, out of pocket $3,900/$406.41 met, co-insurance 0%. no pre-authorization required, REF# [id number]  Will contact patient to see if he is interested in the Cardiac Rehab Program. If interested, patient will need to complete follow up appt. Once completed, patient will be contacted for scheduling upon review by the RN Navigator.

## 2019-09-06 ENCOUNTER — Telehealth (HOSPITAL_COMMUNITY): Payer: Self-pay | Admitting: *Deleted

## 2019-09-06 NOTE — Telephone Encounter (Signed)
Returned call from message left earlier.  Pt wanted specific information about cardiac rehab scheduling, transportation services and if any other participants lived in the [place] area.  Spoke at great length regarding where we are with scheduling and class time availability.  Pt verbalized that he is unable to use the app and would prefer to wait until for an onsite class.  Pt understands there may be a delay.  Pt asked about transportation - advised him of the transportation service through [employer].  Pt will need to have his service dog accompany him for his appt.  Pt will follow up on 3/24.  Pt asked about programs closer to him. Information given about [facility] which pt stated was closer.  Will contact pt after the satisfactory completion of his follow up appt for his preference to attend here at [place] or [place]. Cherre Huger, BSN Cardiac and Training and development officer

## 2019-09-19 NOTE — Progress Notes (Signed)
HPI: FU coronary artery disease; cath in 2004 showing occlusion of OM 1 treated medically. Echo October 2019 showed normal LV function, trace aortic insufficiency, mild left atrial enlargement. Carotid Dopplers October 2019 showed no hemodynamically significant stenosis. Nuclear study January 2021 showed ejection fraction 64%, small area of apical lateral ischemia. Cardiac catheterization February 2021 showed 75% circumflex, 60% second diagonal, occluded second marginal, normal LV function. Patient had PCI of circumflex with drug-eluting stent. Since last seen,patient denies dyspnea.  He has an occasional "twinge" of chest pain for 1 second but no sustained symptoms.  He complains of constipation, general weakness and muscle aches.  Current Outpatient Medications  Medication Sig Dispense Refill  . amLODipine (NORVASC) 5 MG tablet Take 1 tablet (5 mg total) by mouth daily. 90 tablet 3  . Ascorbic Acid (VITAMIN C) 1000 MG tablet Take 1,000 mg by mouth daily.    Marland Kitchen aspirin EC 81 MG tablet Take 81 mg by mouth daily.    Marland Kitchen atorvastatin (LIPITOR) 80 MG tablet Take 1 tablet (80 mg total) by mouth daily at 6 PM. 90 tablet 3  . clopidogrel (PLAVIX) 75 MG tablet Take 1 tablet (75 mg total) by mouth daily with breakfast. 90 tablet 3  . diazepam (VALIUM) 2 MG tablet Take 2 mg by mouth daily as needed (dizziness).     . fish oil-omega-3 fatty acids 1000 MG capsule Take 1,200 mg by mouth 4 (four) times daily.     . imiquimod (ALDARA) 5 % cream Apply 1 application topically daily as needed (Joint pain).     . insulin lispro (HUMALOG) 100 UNIT/ML injection Inject 8-14 Units into the skin See admin instructions. Take 8-14 units in the morning based on blood glucose Take 12-18 in the evening Based on blood glucose Mix with Humulin N    . insulin NPH (HUMULIN N) 100 UNIT/ML injection Inject 12-18 Units into the skin See admin instructions. Take 12-18 units in the morning based on blood glucose Take 16-20 units  in the evening based on blood glucose Mix with Humalog    . loratadine (CLARITIN) 10 MG tablet Take 10 mg by mouth daily.     . metoprolol succinate (TOPROL-XL) 50 MG 24 hr tablet Take 1 tablet (50 mg total) by mouth daily. 90 tablet 3  . Multiple Vitamins-Minerals (CENTRUM SILVER) tablet Take 1 tablet by mouth daily.      . mupirocin ointment (BACTROBAN) 2 % Apply 1 application topically daily as needed (Skin irritation).     . nitroGLYCERIN (NITROSTAT) 0.4 MG SL tablet Place 1 tablet (0.4 mg total) under the tongue every 5 (five) minutes as needed for chest pain (chest pain). 25 tablet 4  . omeprazole (PRILOSEC) 40 MG capsule Take 40 mg by mouth daily.    Marland Kitchen OVER THE COUNTER MEDICATION Take 1 capsule by mouth 2 (two) times daily. oil of oregano    . ramipril (ALTACE) 5 MG capsule Take 5 mg by mouth daily.     . tamsulosin (FLOMAX) 0.4 MG CAPS capsule Take 0.4 mg by mouth at bedtime.     Marland Kitchen zolpidem (AMBIEN) 10 MG tablet Take 10 mg by mouth at bedtime.     No current facility-administered medications for this visit.     Past Medical History:  Diagnosis Date  . Atherosclerotic coronary vascular disease    SINGLE VESSEL OCCULSIVE  . Diabetes mellitus   . Hyperlipidemia   . Hypertension   . IHD (ischemic heart disease)   .  SOB (shortness of breath) on exertion     Past Surgical History:  Procedure Laterality Date  . CARDIAC CATHETERIZATION  08/03/2002   NORMAL LEFT VENTRICULAR SIZE AND EXCELLENT CONTRACTILITY WITH NO SEGMENTAL WALL ABNORMALITIES. EF 70-75%  . CARDIAC CATHETERIZATION  08/29/2019  . CORONARY STENT INTERVENTION N/A 08/29/2019   Procedure: CORONARY STENT INTERVENTION;  Surgeon: Jettie Booze, MD;  Location: [facility];  Service: Cardiovascular;  Laterality: N/A;  . CORONARY STENT PLACEMENT  08/29/2019  . EYE SURGERY    . INTRAVASCULAR ULTRASOUND/IVUS N/A 08/29/2019   Procedure: Intravascular Ultrasound/IVUS;  Surgeon: Jettie Booze, MD;  Location: [facility];  Service: Cardiovascular;  Laterality: N/A;  . LEFT HEART CATH AND CORONARY ANGIOGRAPHY N/A 08/29/2019   Procedure: LEFT HEART CATH AND CORONARY ANGIOGRAPHY;  Surgeon: Jettie Booze, MD;  Location: [facility];  Service: Cardiovascular;  Laterality: N/A;    Social History   Socioeconomic History  . Marital status: Married    Spouse name: Not on file  . Number of children: Not on file  . Years of education: Not on file  . Highest education level: Not on file  Occupational History  . Not on file  Tobacco Use  . Smoking status: Never Smoker  . Smokeless tobacco: Never Used  Substance and Sexual Activity  . Alcohol use: No  . Drug use: No  . Sexual activity: Not on file  Other Topics Concern  . Not on file  Social History Narrative  . Not on file   Social Determinants of Health   Financial Resource Strain:   . Difficulty of Paying Living Expenses:   Food Insecurity:   . Worried About Charity fundraiser in the Last Year:   . Arboriculturist in the Last Year:   Transportation Needs:   . Film/video editor (Medical):   Marland Kitchen Lack of Transportation (Non-Medical):   Physical Activity:   . Days of Exercise per Week:   . Minutes of Exercise per Session:   Stress:   . Feeling of Stress :   Social Connections:   . Frequency of Communication with Friends and Family:   . Frequency of Social Gatherings with Friends and Family:   . Attends Religious Services:   . Active Member of Clubs or Organizations:   . Attends Archivist Meetings:   Marland Kitchen Marital Status:   Intimate Partner Violence:   . Fear of Current or Ex-Partner:   . Emotionally Abused:   Marland Kitchen Physically Abused:   . Sexually Abused:     Family History  Problem Relation Age of Onset  . Arthritis Mother   . Heart disease Father     ROS: no fevers or chills, productive cough, hemoptysis, dysphasia, odynophagia, melena, hematochezia, dysuria, hematuria, rash, seizure activity,  orthopnea, PND, pedal edema, claudication. Remaining systems are negative.  Physical Exam: Well-developed well-nourished in no acute distress.  Skin is warm and dry.  HEENT is normal.  Neck is supple.  Chest is clear to auscultation with normal expansion.  Cardiovascular exam is regular rate and rhythm.  Abdominal exam nontender or distended. No masses palpated. Extremities show no edema. neuro grossly intact  A/P  1 coronary artery disease-patient doing well status post PCI of circumflex. Continue aspirin, Plavix.  2 hypertension-blood pressure controlled.  He is complaining of constipation since initiating amlodipine.  Will discontinue to see if his symptoms improve.  Follow blood pressure and adjust medications as needed.  3 hyperlipidemia-patient describes weakness and muscle aches since beginning Lipitor.  Discontinue for 4 weeks.  If symptoms improve will resume pravastatin 40 mg daily which she tolerated.  Within check lipids and liver 12 weeks later and if not at goal add Zetia or Repatha.  Kirk Ruths, MD

## 2019-09-27 ENCOUNTER — Other Ambulatory Visit: Payer: Self-pay

## 2019-09-27 ENCOUNTER — Encounter: Payer: Self-pay | Admitting: Cardiology

## 2019-09-27 ENCOUNTER — Ambulatory Visit: Payer: Medicare Other | Admitting: Cardiology

## 2019-09-27 VITALS — BP 121/69 | HR 71 | Ht 66.0 in | Wt 186.1 lb

## 2019-09-27 DIAGNOSIS — I10 Essential (primary) hypertension: Secondary | ICD-10-CM | POA: Diagnosis not present

## 2019-09-27 DIAGNOSIS — E7800 Pure hypercholesterolemia, unspecified: Secondary | ICD-10-CM | POA: Diagnosis not present

## 2019-09-27 DIAGNOSIS — I2510 Atherosclerotic heart disease of native coronary artery without angina pectoris: Secondary | ICD-10-CM | POA: Diagnosis not present

## 2019-09-27 NOTE — Patient Instructions (Signed)
Medication Instructions:  STOP AMLODIPINE  STOP ATORVASTATIN FOR 4 WEEKS AND THEN CALL  *If you need a refill on your cardiac medications before your next appointment, please call your pharmacy*   Lab Work: If you have labs (blood work) drawn today and your tests are completely normal, you will receive your results only by: Marland Kitchen MyChart Message (if you have MyChart) OR . A paper copy in the mail If you have any lab test that is abnormal or we need to change your treatment, we will call you to review the results.   Follow-Up: At [facility], you and your health needs are our priority.  As part of our continuing mission to provide you with exceptional heart care, we have created designated Provider Care Teams.  These Care Teams include your primary Cardiologist (physician) and Advanced Practice Providers (APPs -  Physician Assistants and Nurse Practitioners) who all work together to provide you with the care you need, when you need it.  We recommend signing up for the patient portal called "MyChart".  Sign up information is provided on this After Visit Summary.  MyChart is used to connect with patients for Virtual Visits (Telemedicine).  Patients are able to view lab/test results, encounter notes, upcoming appointments, etc.  Non-urgent messages can be sent to your provider as well.   To learn more about what you can do with MyChart, go to [email or web address].    Your next appointment:   3 month(s)  The format for your next appointment:   In Person  Provider:   Kirk Ruths, MD

## 2019-09-28 ENCOUNTER — Encounter (HOSPITAL_COMMUNITY): Payer: Self-pay

## 2019-09-28 NOTE — Telephone Encounter (Signed)
Attempted to call patient in regards to Cardiac Rehab - LM on VM Mailed letter 

## 2019-10-12 ENCOUNTER — Telehealth (HOSPITAL_COMMUNITY): Payer: Self-pay

## 2019-10-12 NOTE — Telephone Encounter (Signed)
Made multiple attempts to reach out to pt regarding cardiac rehab, with no success. Closed referral.

## 2019-11-03 ENCOUNTER — Telehealth: Payer: Self-pay | Admitting: Cardiology

## 2019-11-03 ENCOUNTER — Other Ambulatory Visit: Payer: Self-pay

## 2019-11-03 NOTE — Telephone Encounter (Signed)
Pt calling stating Dr.Crenshaw has already approved him for Cardiac Rehab, he just needs a paper for a record of approval sent to [facility] because it is closer to where he lives. Notified we would send this to [facility] now and if he had any other issues to let us know. Pt verbalized understanding with no other questions at this time.

## 2019-11-03 NOTE — Telephone Encounter (Signed)
Orders opened in error

## 2019-11-03 NOTE — Telephone Encounter (Signed)
Patient states he is requesting to have a record of approval for Cardiac Rehab faxed to [facility] at [phone]. Please assist.

## 2019-11-20 ENCOUNTER — Telehealth: Payer: Self-pay | Admitting: Cardiology

## 2019-11-20 NOTE — Telephone Encounter (Signed)
New Message.   *STAT* If patient is at the pharmacy, call can be transferred to refill team.   1. Which medications need to be refilled? (please list name of each medication and dose if known) clopidogrel (PLAVIX) 75 MG tablet  2. Which pharmacy/location (including street and city if local pharmacy) is medication to be sent to? [place], [address]  3. Do they need a 30 day or 90 day supply? 90 day

## 2019-11-21 ENCOUNTER — Other Ambulatory Visit: Payer: Self-pay

## 2019-11-21 MED ORDER — CLOPIDOGREL BISULFATE 75 MG PO TABS: 75.0000 mg | ORAL_TABLET | Freq: Every day | ORAL | 3 refills | Status: DC

## 2019-12-05 NOTE — Progress Notes (Signed)
HPI: FU coronary artery disease; cath in 2004 showing occlusion of OM 1 treated medically. Echo October 2019 showed normal LV function, trace aortic insufficiency, mild left atrial enlargement. Carotid Dopplers October 2019 showed no hemodynamically significant stenosis. Nuclear study January 2021 showed ejection fraction 64%, small area of apical lateral ischemia. Cardiac catheterization February 2021 showed 75% circumflex, 60% second diagonal, occluded second marginal, normal LV function. Patient had PCI of circumflex with drug-eluting stent.  Lipitor changed to Crestor at last office visit due to myalgias.  Since last seen,patient denies dyspnea, chest pain, palpitations or syncope.  Current Outpatient Medications  Medication Sig Dispense Refill  . Ascorbic Acid (VITAMIN C) 1000 MG tablet Take 1,000 mg by mouth daily.    Marland Kitchen aspirin EC 81 MG tablet Take 81 mg by mouth daily.    . clopidogrel (PLAVIX) 75 MG tablet Take 1 tablet (75 mg total) by mouth daily with breakfast. 90 tablet 3  . diazepam (VALIUM) 2 MG tablet Take 2 mg by mouth daily as needed (dizziness).     . fish oil-omega-3 fatty acids 1000 MG capsule Take 1,200 mg by mouth 4 (four) times daily.     . imiquimod (ALDARA) 5 % cream Apply 1 application topically daily as needed (Joint pain).     . insulin lispro (HUMALOG) 100 UNIT/ML injection Inject 8-14 Units into the skin See admin instructions. Take 8-14 units in the morning based on blood glucose Take 12-18 in the evening Based on blood glucose Mix with Humulin N    . insulin NPH (HUMULIN N) 100 UNIT/ML injection Inject 12-18 Units into the skin See admin instructions. Take 12-18 units in the morning based on blood glucose Take 16-20 units in the evening based on blood glucose Mix with Humalog    . loratadine (CLARITIN) 10 MG tablet Take 10 mg by mouth daily.     . metoprolol succinate (TOPROL-XL) 50 MG 24 hr tablet Take 1 tablet (50 mg total) by mouth daily. 90 tablet 3  .  Multiple Vitamins-Minerals (CENTRUM SILVER) tablet Take 1 tablet by mouth daily.      . mupirocin ointment (BACTROBAN) 2 % Apply 1 application topically daily as needed (Skin irritation).     . nitroGLYCERIN (NITROSTAT) 0.4 MG SL tablet Place 1 tablet (0.4 mg total) under the tongue every 5 (five) minutes as needed for chest pain (chest pain). 25 tablet 4  . omeprazole (PRILOSEC) 40 MG capsule Take 40 mg by mouth daily.    Marland Kitchen OVER THE COUNTER MEDICATION Take 1 capsule by mouth 2 (two) times daily. oil of oregano    . ramipril (ALTACE) 5 MG capsule Take 5 mg by mouth daily.     . tamsulosin (FLOMAX) 0.4 MG CAPS capsule Take 0.4 mg by mouth at bedtime.     Marland Kitchen zolpidem (AMBIEN) 10 MG tablet Take 10 mg by mouth at bedtime.     No current facility-administered medications for this visit.     Past Medical History:  Diagnosis Date  . Atherosclerotic coronary vascular disease    SINGLE VESSEL OCCULSIVE  . Diabetes mellitus   . Hyperlipidemia   . Hypertension   . IHD (ischemic heart disease)   . SOB (shortness of breath) on exertion     Past Surgical History:  Procedure Laterality Date  . CARDIAC CATHETERIZATION  08/03/2002   NORMAL LEFT VENTRICULAR SIZE AND EXCELLENT CONTRACTILITY WITH NO SEGMENTAL WALL ABNORMALITIES. EF 70-75%  . CARDIAC CATHETERIZATION  08/29/2019  .  CORONARY STENT INTERVENTION N/A 08/29/2019   Procedure: CORONARY STENT INTERVENTION;  Surgeon: Jettie Booze, MD;  Location: [facility];  Service: Cardiovascular;  Laterality: N/A;  . CORONARY STENT PLACEMENT  08/29/2019  . EYE SURGERY    . INTRAVASCULAR ULTRASOUND/IVUS N/A 08/29/2019   Procedure: Intravascular Ultrasound/IVUS;  Surgeon: Jettie Booze, MD;  Location: [facility];  Service: Cardiovascular;  Laterality: N/A;  . LEFT HEART CATH AND CORONARY ANGIOGRAPHY N/A 08/29/2019   Procedure: LEFT HEART CATH AND CORONARY ANGIOGRAPHY;  Surgeon: Jettie Booze, MD;  Location: [facility];   Service: Cardiovascular;  Laterality: N/A;    Social History   Socioeconomic History  . Marital status: Married    Spouse name: Not on file  . Number of children: Not on file  . Years of education: Not on file  . Highest education level: Not on file  Occupational History  . Not on file  Tobacco Use  . Smoking status: Never Smoker  . Smokeless tobacco: Never Used  Substance and Sexual Activity  . Alcohol use: No  . Drug use: No  . Sexual activity: Not on file  Other Topics Concern  . Not on file  Social History Narrative  . Not on file   Social Determinants of Health   Financial Resource Strain:   . Difficulty of Paying Living Expenses:   Food Insecurity:   . Worried About Charity fundraiser in the Last Year:   . Arboriculturist in the Last Year:   Transportation Needs:   . Film/video editor (Medical):   Marland Kitchen Lack of Transportation (Non-Medical):   Physical Activity:   . Days of Exercise per Week:   . Minutes of Exercise per Session:   Stress:   . Feeling of Stress :   Social Connections:   . Frequency of Communication with Friends and Family:   . Frequency of Social Gatherings with Friends and Family:   . Attends Religious Services:   . Active Member of Clubs or Organizations:   . Attends Archivist Meetings:   Marland Kitchen Marital Status:   Intimate Partner Violence:   . Fear of Current or Ex-Partner:   . Emotionally Abused:   Marland Kitchen Physically Abused:   . Sexually Abused:     Family History  Problem Relation Age of Onset  . Arthritis Mother   . Heart disease Father     ROS: no fevers or chills, productive cough, hemoptysis, dysphasia, odynophagia, melena, hematochezia, dysuria, hematuria, rash, seizure activity, orthopnea, PND, pedal edema, claudication. Remaining systems are negative.  Physical Exam: Well-developed well-nourished in no acute distress.  Skin is warm and dry.  HEENT is normal.  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  Chest is clear to auscultation with  normal expansion.  Cardiovascular exam is regular rate and rhythm.  Abdominal exam nontender or distended. No masses palpated. Extremities show no edema. neuro grossly intact  A/P  1.  Coronary artery disease-plan to continue aspirin, Plavix.  2 hypertension-patient's blood pressure is controlled.  Continue present medical regimen.  3 hyperlipidemia-patient did not tolerate high-dose Lipitor or Crestor.  We will resume pravastatin 40 mg daily.  Check lipids and liver in 12 weeks.  If not at goal we will add Zetia or consider Repatha.  Note he did not tolerate pravastatin 80 mg in the past.  Kirk Ruths, MD

## 2019-12-06 ENCOUNTER — Ambulatory Visit: Payer: Medicare Other | Admitting: Cardiology

## 2019-12-06 ENCOUNTER — Other Ambulatory Visit: Payer: Self-pay

## 2019-12-06 ENCOUNTER — Encounter: Payer: Self-pay | Admitting: Cardiology

## 2019-12-06 VITALS — BP 130/62 | HR 67 | Ht 66.0 in | Wt 170.4 lb

## 2019-12-06 DIAGNOSIS — I2510 Atherosclerotic heart disease of native coronary artery without angina pectoris: Secondary | ICD-10-CM

## 2019-12-06 DIAGNOSIS — E7800 Pure hypercholesterolemia, unspecified: Secondary | ICD-10-CM

## 2019-12-06 DIAGNOSIS — I10 Essential (primary) hypertension: Secondary | ICD-10-CM

## 2019-12-06 MED ORDER — PRAVASTATIN SODIUM 40 MG PO TABS: 40.0000 mg | ORAL_TABLET | Freq: Every evening | ORAL | 3 refills | Status: DC

## 2019-12-06 NOTE — Patient Instructions (Signed)
Medication Instructions:   START PRAVASTATIN 40 MG ONCE DAILY  *If you need a refill on your cardiac medications before your next appointment, please call your pharmacy*   Lab Work:  Your physician recommends that you return for lab work in: [place]  If you have labs (blood work) drawn today and your tests are completely normal, you will receive your results only by: Marland Kitchen MyChart Message (if you have MyChart) OR . A paper copy in the mail If you have any lab test that is abnormal or we need to change your treatment, we will call you to review the results.   Follow-Up: At [facility], you and your health needs are our priority.  As part of our continuing mission to provide you with exceptional heart care, we have created designated Provider Care Teams.  These Care Teams include your primary Cardiologist (physician) and Advanced Practice Providers (APPs -  Physician Assistants and Nurse Practitioners) who all work together to provide you with the care you need, when you need it.  We recommend signing up for the patient portal called "MyChart".  Sign up information is provided on this After Visit Summary.  MyChart is used to connect with patients for Virtual Visits (Telemedicine).  Patients are able to view lab/test results, encounter notes, upcoming appointments, etc.  Non-urgent messages can be sent to your provider as well.   To learn more about what you can do with MyChart, go to [email or web address].    Your next appointment:   9 month(s)  The format for your next appointment:   In Person  Provider:   Kirk Ruths, MD

## 2020-04-10 ENCOUNTER — Encounter: Payer: Self-pay | Admitting: *Deleted

## 2020-04-25 ENCOUNTER — Encounter: Payer: Self-pay | Admitting: *Deleted

## 2020-04-25 LAB — LIPID PANEL
Cholesterol: 122 mg/dL (ref <200)
HDL: 37 mg/dL — ABNORMAL LOW (ref >=40)
LDL Cholesterol (Calc): 63 mg/dL (calc)
Non-HDL Cholesterol (Calc): 85 mg/dL (calc) (ref <130)
Total CHOL/HDL Ratio: 3.3 (calc) (ref <5.0)
Triglycerides: 138 mg/dL (ref <150)

## 2020-04-25 LAB — HEPATIC FUNCTION PANEL
AG Ratio: 1.6 (calc) (ref 1.0–2.5)
ALT: 16 U/L (ref 9–46)
AST: 20 U/L (ref 10–35)
Albumin: 4.3 g/dL (ref 3.6–5.1)
Alkaline phosphatase (APISO): 64 U/L (ref 35–144)
Bilirubin, Direct: 0.1 mg/dL (ref 0.0–0.2)
Globulin: 2.7 g/dL (calc) (ref 1.9–3.7)
Indirect Bilirubin: 0.4 mg/dL (calc) (ref 0.2–1.2)
Total Bilirubin: 0.5 mg/dL (ref 0.2–1.2)
Total Protein: 7 g/dL (ref 6.1–8.1)

## 2020-08-19 NOTE — Progress Notes (Signed)
HPI: FU coronary artery disease; cath in 2004 showing occlusion of OM 1 treated medically. Echo October 2019 showed normal LV function, trace aortic insufficiency, mild left atrial enlargement. Carotid Dopplers October 2019 showed no hemodynamically significant stenosis. Nuclear study January 2021 showed ejection fraction 64%, small area of apical lateral ischemia.Cardiac catheterization February 2021 showed 75% circumflex, 60% second diagonal, occluded second marginal, normal LV function. Patient had PCI of circumflex with drug-eluting stent.  Lipitor changed to Crestor at last office visit due to myalgias.  Since last seen, the patient denies any dyspnea on exertion, orthopnea, PND, pedal edema, palpitations, syncope or chest pain.   Current Outpatient Medications  Medication Sig Dispense Refill  . Ascorbic Acid (VITAMIN C) 1000 MG tablet Take 1,000 mg by mouth daily.    Marland Kitchen aspirin EC 81 MG tablet Take 81 mg by mouth daily.    . clopidogrel (PLAVIX) 75 MG tablet Take 1 tablet (75 mg total) by mouth daily with breakfast. 90 tablet 3  . diazepam (VALIUM) 2 MG tablet Take 2 mg by mouth daily as needed (dizziness).     . fish oil-omega-3 fatty acids 1000 MG capsule Take 1,200 mg by mouth 4 (four) times daily.    . imiquimod (ALDARA) 5 % cream Apply 1 application topically daily as needed (Joint pain).     . insulin lispro (HUMALOG) 100 UNIT/ML injection Inject 8-14 Units into the skin See admin instructions. Take 8-14 units in the morning based on blood glucose Take 12-18 in the evening Based on blood glucose Mix with Humulin N    . insulin NPH Human (NOVOLIN N) 100 UNIT/ML injection Inject 12-18 Units into the skin See admin instructions. Take 12-18 units in the morning based on blood glucose Take 16-20 units in the evening based on blood glucose Mix with Humalog    . loratadine (CLARITIN) 10 MG tablet Take 10 mg by mouth daily.    . metoprolol succinate (TOPROL-XL) 50 MG 24 hr tablet Take  1 tablet (50 mg total) by mouth daily. 90 tablet 3  . Multiple Vitamins-Minerals (CENTRUM SILVER) tablet Take 1 tablet by mouth daily.    . mupirocin ointment (BACTROBAN) 2 % Apply 1 application topically daily as needed (Skin irritation).     . nitroGLYCERIN (NITROSTAT) 0.4 MG SL tablet Place 1 tablet (0.4 mg total) under the tongue every 5 (five) minutes as needed for chest pain (chest pain). 25 tablet 4  . OVER THE COUNTER MEDICATION Take 1 capsule by mouth 2 (two) times daily. oil of oregano    . pantoprazole (PROTONIX) 40 MG tablet Take 40 mg by mouth daily.    . polyethylene glycol powder (GLYCOLAX/MIRALAX) 17 GM/SCOOP powder Take by mouth.    . ramipril (ALTACE) 5 MG capsule Take 5 mg by mouth daily.    . sucralfate (CARAFATE) 1 g tablet Take by mouth.    . tamsulosin (FLOMAX) 0.4 MG CAPS capsule Take 0.4 mg by mouth at bedtime.     Marland Kitchen zolpidem (AMBIEN) 10 MG tablet Take 10 mg by mouth at bedtime.    . pravastatin (PRAVACHOL) 40 MG tablet Take 1 tablet (40 mg total) by mouth every evening. 90 tablet 3   No current facility-administered medications for this visit.     Past Medical History:  Diagnosis Date  . Atherosclerotic coronary vascular disease    SINGLE VESSEL OCCULSIVE  . Diabetes mellitus   . Hyperlipidemia   . Hypertension   . IHD (ischemic heart  disease)   . SOB (shortness of breath) on exertion     Past Surgical History:  Procedure Laterality Date  . CARDIAC CATHETERIZATION  08/03/2002   NORMAL LEFT VENTRICULAR SIZE AND EXCELLENT CONTRACTILITY WITH NO SEGMENTAL WALL ABNORMALITIES. EF 70-75%  . CARDIAC CATHETERIZATION  08/29/2019  . CORONARY STENT INTERVENTION N/A 08/29/2019   Procedure: CORONARY STENT INTERVENTION;  Surgeon: Jettie Booze, MD;  Location: [facility];  Service: Cardiovascular;  Laterality: N/A;  . CORONARY STENT PLACEMENT  08/29/2019  . EYE SURGERY    . INTRAVASCULAR ULTRASOUND/IVUS N/A 08/29/2019   Procedure: Intravascular  Ultrasound/IVUS;  Surgeon: Jettie Booze, MD;  Location: [facility];  Service: Cardiovascular;  Laterality: N/A;  . LEFT HEART CATH AND CORONARY ANGIOGRAPHY N/A 08/29/2019   Procedure: LEFT HEART CATH AND CORONARY ANGIOGRAPHY;  Surgeon: Jettie Booze, MD;  Location: [facility];  Service: Cardiovascular;  Laterality: N/A;    Social History   Socioeconomic History  . Marital status: Married    Spouse name: Not on file  . Number of children: Not on file  . Years of education: Not on file  . Highest education level: Not on file  Occupational History  . Not on file  Tobacco Use  . Smoking status: Never Smoker  . Smokeless tobacco: Never Used  Vaping Use  . Vaping Use: Never used  Substance and Sexual Activity  . Alcohol use: No  . Drug use: No  . Sexual activity: Not on file  Other Topics Concern  . Not on file  Social History Narrative  . Not on file   Social Determinants of Health   Financial Resource Strain: Not on file  Food Insecurity: Not on file  Transportation Needs: Not on file  Physical Activity: Not on file  Stress: Not on file  Social Connections: Not on file  Intimate Partner Violence: Not on file    Family History  Problem Relation Age of Onset  . Arthritis Mother   . Heart disease Father     ROS: no fevers or chills, productive cough, hemoptysis, dysphasia, odynophagia, melena, hematochezia, dysuria, hematuria, rash, seizure activity, orthopnea, PND, pedal edema, claudication. Remaining systems are negative.  Physical Exam: Well-developed well-nourished in no acute distress.  Skin is warm and dry.  HEENT is normal.  Neck is supple.  Chest is clear to auscultation with normal expansion.  Cardiovascular exam is regular rate and rhythm.  Abdominal exam nontender or distended. No masses palpated. Extremities show no edema. neuro -blind  A/P  1 coronary artery disease-continue aspirin but discontinue Plavix.  Continue  statin.  2 hyperlipidemia-patient did not tolerate high-dose Lipitor or Crestor previously.  Continue pravastatin at present dose.    3 hypertension-blood pressure elevated; increase Altace to 10 mg daily and follow blood pressure.  Check potassium and renal function in 1 week.  Kirk Ruths, MD

## 2020-08-28 ENCOUNTER — Other Ambulatory Visit: Payer: Self-pay

## 2020-08-28 ENCOUNTER — Encounter: Payer: Self-pay | Admitting: Cardiology

## 2020-08-28 ENCOUNTER — Ambulatory Visit: Payer: Medicare Other | Admitting: Cardiology

## 2020-08-28 VITALS — BP 156/66 | HR 66 | Ht 66.0 in | Wt 170.0 lb

## 2020-08-28 DIAGNOSIS — I10 Essential (primary) hypertension: Secondary | ICD-10-CM | POA: Diagnosis not present

## 2020-08-28 DIAGNOSIS — I2510 Atherosclerotic heart disease of native coronary artery without angina pectoris: Secondary | ICD-10-CM

## 2020-08-28 DIAGNOSIS — E7800 Pure hypercholesterolemia, unspecified: Secondary | ICD-10-CM

## 2020-08-28 MED ORDER — RAMIPRIL 10 MG PO CAPS: 10.0000 mg | ORAL_CAPSULE | Freq: Every day | ORAL | 3 refills | Status: DC

## 2020-08-28 MED ORDER — NITROGLYCERIN 0.4 MG SL SUBL: 0.4000 mg | SUBLINGUAL_TABLET | SUBLINGUAL | 4 refills | Status: DC | PRN

## 2020-08-28 NOTE — Patient Instructions (Signed)
Medication Instructions:   INCREASE RAMAPRIL TO 10 MG ONCE DAILY= 2 OF THE 5 MG TABLETS ONCE DAILY  STOP PLAVIX  *If you need a refill on your cardiac medications before your next appointment, please call your pharmacy*   Lab Work:  Your physician recommends that you return for lab work ONE WEEK  If you have labs (blood work) drawn today and your tests are completely normal, you will receive your results only by: Marland Kitchen MyChart Message (if you have MyChart) OR . A paper copy in the mail If you have any lab test that is abnormal or we need to change your treatment, we will call you to review the results.   Follow-Up: At [facility], you and your health needs are our priority.  As part of our continuing mission to provide you with exceptional heart care, we have created designated Provider Care Teams.  These Care Teams include your primary Cardiologist (physician) and Advanced Practice Providers (APPs -  Physician Assistants and Nurse Practitioners) who all work together to provide you with the care you need, when you need it.  We recommend signing up for the patient portal called "MyChart".  Sign up information is provided on this After Visit Summary.  MyChart is used to connect with patients for Virtual Visits (Telemedicine).  Patients are able to view lab/test results, encounter notes, upcoming appointments, etc.  Non-urgent messages can be sent to your provider as well.   To learn more about what you can do with MyChart, go to [email or web address].    Your next appointment:   6 month(s)  The format for your next appointment:   In Person  Provider:   Kirk Ruths, MD

## 2020-09-05 ENCOUNTER — Encounter: Payer: Self-pay | Admitting: *Deleted

## 2020-09-05 LAB — BASIC METABOLIC PANEL
BUN: 17 mg/dL (ref 7–25)
CO2: 31 mmol/L (ref 20–32)
Calcium: 9.5 mg/dL (ref 8.6–10.3)
Chloride: 105 mmol/L (ref 98–110)
Creat: 1.1 mg/dL (ref 0.70–1.25)
Glucose, Bld: 52 mg/dL — ABNORMAL LOW (ref 65–139)
Potassium: 4.6 mmol/L (ref 3.5–5.3)
Sodium: 140 mmol/L (ref 135–146)

## 2020-09-13 ENCOUNTER — Other Ambulatory Visit: Payer: Self-pay | Admitting: Cardiology

## 2020-09-13 DIAGNOSIS — I2510 Atherosclerotic heart disease of native coronary artery without angina pectoris: Secondary | ICD-10-CM

## 2020-09-30 ENCOUNTER — Telehealth: Payer: Self-pay | Admitting: Cardiology

## 2020-09-30 ENCOUNTER — Other Ambulatory Visit: Payer: Self-pay | Admitting: Cardiology

## 2020-09-30 DIAGNOSIS — I2510 Atherosclerotic heart disease of native coronary artery without angina pectoris: Secondary | ICD-10-CM

## 2020-09-30 MED ORDER — AMLODIPINE BESYLATE 5 MG PO TABS: 5.0000 mg | ORAL_TABLET | Freq: Every day | ORAL | 3 refills | Status: DC

## 2020-09-30 NOTE — Telephone Encounter (Signed)
Pt c/o BP issue: STAT if pt c/o blurred vision, one-sided weakness or slurred speech  1. What are your last 5 BP readings?  03/28:  143/54  72  170/60s  2. Are you having any other symptoms (ex. Dizziness, headache, blurred vision, passed out)? Jitteriness / SOB  3. What is your BP issue? Patient states his BP has been elevated    Pt c/o Shortness Of Breath: STAT if SOB developed within the last 24 hours or pt is noticeably SOB on the phone  1. Are you currently SOB (can you hear that pt is SOB on the phone)? No   2. How long have you been experiencing SOB? About 1 month, per patient  3. Are you SOB when sitting or when up moving around? When up and moving around  4. Are you currently experiencing any other symptoms? Chest pain     Pt c/o of Chest Pain: STAT if CP now or developed within 24 hours  1. Are you having CP right now? No   2. Are you experiencing any other symptoms (ex. SOB, nausea, vomiting, sweating)? Left arm pain   3. How long have you been experiencing CP? Patient states he has had chest pain on and off for years  4. Is your CP continuous or coming and going? Coming and going   5. Have you taken Nitroglycerin?  ?

## 2020-09-30 NOTE — Telephone Encounter (Signed)
Add amlodipine 5 mg daily and follow blood pressure. Kirk Ruths

## 2020-09-30 NOTE — Telephone Encounter (Signed)
Spoke with pt, Aware of dr crenshaw's recommendations. New script sent to the pharmacy  

## 2020-09-30 NOTE — Telephone Encounter (Signed)
Spoke with the patient who states that his blood pressure has been elevated for the past couple of months. He states that he did increase his Altace to 10 mg daily as advised by Dr. Stanford Breed at his office visit on 2/23. He states that blood pressure has improved but he is still getting readings 140s-150s/60s. He states heart rate has been in the 70s the past couple of days. Denies any palpitations. He also reports that he has been getting more short of breath with exertion. He states that he walks his dogs often and has been getting more fatigued towards the end of his walks than he used to. Improves with rest. Denies any swelling, weight gain, or increased salt in diet.  Patient reports on and off chest pain described as a "twinge" on his left side. He states that the pain comes and goes and has been present for a while. He states about 10 days ago when he had the pain he took a nitroglycerin and symptoms improved. He reports a "burp" that is coming from his heart.  He reports that he does get dizzy and lightheaded when he bends over. Advised to ensure he is staying hydrated.  He also reports that he had some flashing light sensations and was recently seen by neurology. They were concerned for TIAs. He had an MRI on Friday and is awaiting those results.  Patient denies any current chest pain, shortness of breath, or dizziness. Advised to continue current medications, ensure he is staying hydrated and changing positions slowly.  Will route to Dr. Stanford Breed for any further advisement.

## 2020-11-06 ENCOUNTER — Telehealth: Payer: Self-pay | Admitting: Cardiology

## 2020-11-06 MED ORDER — RAMIPRIL 5 MG PO CAPS: 5.0000 mg | ORAL_CAPSULE | Freq: Every day | ORAL | 2 refills | Status: DC

## 2020-11-06 NOTE — Telephone Encounter (Signed)
Patient advised of dose change. Rx(s) sent to pharmacy electronically.

## 2020-11-06 NOTE — Telephone Encounter (Signed)
    Pt c/o medication issue:  1. Name of Medication:   ramipril (ALTACE) 10 MG capsule    2. How are you currently taking this medication (dosage and times per day)? Take 1 capsule (10 mg total) by mouth daily.  3. Are you having a reaction (difficulty breathing--STAT)?   4. What is your medication issue? Pt would like to request lowering her Altace to 5 mg, he said his BP getting too low

## 2020-11-06 NOTE — Telephone Encounter (Signed)
Patient returning call. BP readings from 98/40 this morning, just now 115/48. States he has some dizziness and stress. He states his breathing is also deeper.

## 2020-11-06 NOTE — Telephone Encounter (Signed)
Message  Change ramipril to 5 mg daily and follow BP  Tommy Lopez

## 2020-11-06 NOTE — Telephone Encounter (Signed)
Spoke with patient of Dr. Stanford Breed who is requesting to decrease ramipril dose to 5mg  due to lower BP readings. He reports dizziness, "stressful breathing". He reports diastolic readings in to low 40s. He reports amlodipine is good for his BP but would like to decrease ramipril  11/06/20 98/40  115/48 130/56 HR 63 (has not taken amlodipine yet)  90/38  He cannot recall other readings and his machine does not have a callback feature. 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  Metoprolol succinate 50mg  - QAM Amlodipine 5mg  - midday  Ramipril 10mg  - QHS  Pharmacy: [facility] will send to Dr. Stanford Breed & Hilda Blades RN regarding lower BP readings and request for med change

## 2020-11-06 NOTE — Telephone Encounter (Signed)
Left message to call back.  Before med changes can be made, need log of reported low BP readings

## 2020-11-21 ENCOUNTER — Telehealth: Payer: Self-pay | Admitting: Cardiology

## 2020-11-21 NOTE — Telephone Encounter (Signed)
Spoke to patient he stated his B/P is low after her walks 91/53,119/59.Stated he tires easy.He has chest pressure occasional.Sob with exertion.He stopped taking Amlodipine 3 days ago.He has not noticed any difference in B/P readings.Appointment scheduled with Coletta Memos NP 12/17/20 at 2:15 pm.Advised to bring B/P readings to appointment. Advised I will make Dr.Crenshaw aware.

## 2020-11-21 NOTE — Telephone Encounter (Signed)
Pt c/o BP issue: STAT if pt c/o blurred vision, one-sided weakness or slurred speech  1. What are your last 5 BP readings? 119/53  91/41 after walking - seem like blood pressure is getting lower after walking  2. Are you having any other symptoms (ex. Dizziness, headache, blurred vision, passed out)? Dizziness, shortness of breath and chest pressure, but not at this time  3. What is your BP issue? Low blood pressure after walking and exerting

## 2020-11-22 NOTE — Addendum Note (Signed)
Addended by: Cristopher Estimable on: 11/22/2020 11:42 AM   Modules accepted: Orders

## 2020-11-22 NOTE — Telephone Encounter (Addendum)
DC altace; follow BP; agree with fu ov  [place] with pt, Aware of dr Jacalyn Lefevre recommendations.

## 2020-12-03 ENCOUNTER — Other Ambulatory Visit: Payer: Self-pay | Admitting: Cardiology

## 2020-12-03 DIAGNOSIS — E7800 Pure hypercholesterolemia, unspecified: Secondary | ICD-10-CM

## 2020-12-15 NOTE — Progress Notes (Signed)
Cardiology Clinic Note   Patient Name: Tommy Lopez Date of Encounter: 12/17/2020  Primary Care Provider:  Reynold Bowen, MD Primary Cardiologist:  Kirk Ruths, MD  Patient Profile    Tommy Lopez 70 year old male presents the clinic today for an evaluation of his chest pressure.  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 EF 70-75%   CARDIAC CATHETERIZATION  08/29/2019   CORONARY STENT INTERVENTION N/A 08/29/2019   Procedure: CORONARY STENT INTERVENTION;  Surgeon: Jettie Booze, MD;  Location: [facility];  Service: Cardiovascular;  Laterality: N/A;   CORONARY STENT PLACEMENT  08/29/2019   EYE SURGERY     INTRAVASCULAR ULTRASOUND/IVUS N/A 08/29/2019   Procedure: Intravascular Ultrasound/IVUS;  Surgeon: Jettie Booze, MD;  Location: [facility];  Service: Cardiovascular;  Laterality: N/A;   LEFT HEART CATH AND CORONARY ANGIOGRAPHY N/A 08/29/2019   Procedure: LEFT HEART CATH AND CORONARY ANGIOGRAPHY;  Surgeon: Jettie Booze, MD;  Location: [facility];  Service: Cardiovascular;  Laterality: N/A;    Allergies  No Known Allergies  History of Present Illness    [name] underwent cardiac catheterization in 2004 that showed occlusion of OM1 which treated medically.  Echocardiogram 10/19 showed normal LV function, trace aortic insufficiency, mild left atrial enlargement.  His carotid Dopplers 10/19 showed no hemodynamically significant stenosis.  A nuclear stress test 1/21 showed an EF of 64%, small area of apical lateral  ischemia.  He underwent cardiac catheterization 2/21 which showed 75% circumflex, 60% second diagonal, occluded second marginal, and normal left ventricular ejection fraction.  He had PCI of his circumflex with DES x1.  His atorvastatin was changed to Crestor due to myalgias.  He was seen by Dr. Stanford Breed on 08/28/2020.  During that time he denied dyspnea on exertion, orthopnea, PND, lower extremity edema, palpitations, syncope and chest discomfort.  He contacted nurse triage line on 11/21/2020.  He reported that he noticed lower blood pressures after walking.  He did experience some dizziness, shortness of breath and chest pressure.  However, he was not currently experiencing symptoms.  He presents the clinic today for follow-up evaluation states he continues to have episodes of dizziness and low blood pressure after his morning exercise.  He brings in blood pressure log today that shows blood pressure systolic in the low 315V and 100s.  He reports that he stays well-hydrated.  He is also noticed that he has decreased exercise tolerance and some increased dyspnea with activities that he did not notice dyspnea with previously.  We discussed echocardiogram and blood pressure medication.  He and his wife expressed understanding.  I will order an echocardiogram, have him maintain his hydration, decrease his amlodipine to 2.5 mg daily, and have him follow-up in 1 to 2 months.  Today he denies chest pain, shortness of breath, lower extremity edema, fatigue, palpitations, melena, hematuria, hemoptysis, diaphoresis, weakness, presyncope, syncope, orthopnea, and PND.   Home Medications    Prior to Admission medications   Medication Sig Start Date End Date Taking? Authorizing Provider  amLODipine (NORVASC) 5 MG tablet Take 1 tablet (5 mg total) by mouth daily. 09/30/20 12/29/20  Lelon Perla, MD  Ascorbic Acid (VITAMIN C) 1000 MG tablet Take 1,000 mg by mouth daily.    [provider]  aspirin EC 81  MG tablet Take 81 mg by mouth daily.    [provider]  diazepam (VALIUM) 2 MG tablet Take 2 mg by mouth daily as needed (dizziness).  10/22/15   [provider]  fish oil-omega-3 fatty acids 1000 MG capsule Take 1,200 mg by mouth 4 (four) times daily.    [provider]  imiquimod (ALDARA) 5 % cream Apply 1 application topically daily as needed (Joint pain).  07/13/19   [provider]  insulin lispro (HUMALOG) 100 UNIT/ML injection Inject 8-14 Units into the skin See admin instructions. Take 8-14 units in the morning based on blood glucose Take 12-18 in the evening Based on blood glucose Mix with Humulin N    [provider]  insulin NPH Human (NOVOLIN N) 100 UNIT/ML injection Inject 12-18 Units into the skin See admin instructions. Take 12-18 units in the morning based on blood glucose Take 16-20 units in the evening based on blood glucose Mix with Humalog    [provider]  loratadine (CLARITIN) 10 MG tablet Take 10 mg by mouth daily.    [provider]  metoprolol succinate (TOPROL-XL) 50 MG 24 hr tablet Take 1 tablet (50 mg total) by mouth daily. 09/30/20   Lelon Perla, MD  Multiple Vitamins-Minerals (CENTRUM SILVER) tablet Take 1 tablet by mouth daily.    [provider]  mupirocin ointment (BACTROBAN) 2 % Apply 1 application topically daily as needed (Skin irritation).  06/05/14   [provider]  nitroGLYCERIN (NITROSTAT) 0.4 MG SL tablet Place 1 tablet (0.4 mg total) under the tongue every 5 (five) minutes as needed for chest pain (chest pain). 08/28/20   Lelon Perla, MD  OVER THE COUNTER MEDICATION Take 1 capsule by mouth 2 (two) times daily. oil of oregano    [provider]  pantoprazole (PROTONIX) 40 MG tablet Take 40 mg by mouth daily. 08/26/20   [provider]  polyethylene glycol powder (GLYCOLAX/MIRALAX) 17 GM/SCOOP powder Take by mouth. 02/21/20   [provider]   pravastatin (PRAVACHOL) 40 MG tablet Take 1 tablet (40 mg total) by mouth every evening. 12/03/20 03/03/21  Lelon Perla, MD  sucralfate (CARAFATE) 1 g tablet Take by mouth. 02/21/20   [provider]  tamsulosin (FLOMAX) 0.4 MG CAPS capsule Take 0.4 mg by mouth at bedtime.  06/26/19   [provider]  zolpidem (AMBIEN) 10 MG tablet Take 10 mg by mouth at bedtime. 04/30/15   [provider]    Family History    Family History  Problem Relation Age of Onset   Arthritis Mother    Heart disease Father    He indicated that his mother is deceased. He indicated that his father is deceased. He indicated that his maternal grandmother is deceased. He indicated that his maternal grandfather is deceased. He indicated that his paternal grandmother is deceased. He indicated that his paternal grandfather is deceased.  Social History    Social History   Socioeconomic History   Marital status: Married    Spouse name: Not on file   Number of children: Not on file   Years of education: Not on file   Highest education level: Not on file  Occupational History  Not on file  Tobacco Use   Smoking status: Never   Smokeless tobacco: Never  Vaping Use   Vaping Use: Never used  Substance and Sexual Activity   Alcohol use: No   Drug use: No   Sexual activity: Not on file  Other Topics Concern   Not on file  Social History Narrative   Not on file   Social Determinants of Health   Financial Resource Strain: Not on file  Food Insecurity: Not on file  Transportation Needs: Not on file  Physical Activity: Not on file  Stress: Not on file  Social Connections: Not on file  Intimate Partner Violence: Not on file     Review of Systems    General:  No chills, fever, night sweats or weight changes.  Cardiovascular:  No chest pain, dyspnea on exertion, edema, orthopnea, palpitations, paroxysmal nocturnal dyspnea. Dermatological: No rash, lesions/masses Respiratory:  No cough, dyspnea Urologic: No hematuria, dysuria Abdominal:   No nausea, vomiting, diarrhea, bright red blood per rectum, melena, or hematemesis Neurologic:  No visual changes, wkns, changes in mental status. All other systems reviewed and are otherwise negative except as noted above.  Physical Exam    VS:  BP (!) 128/54   Pulse 71   Ht 5\' 6"  (1.676 m)   Wt 159 lb 12.8 oz (72.5 kg)   SpO2 96%   BMI 25.79 kg/m  , BMI Body mass index is 25.79 kg/m. GEN: Well nourished, well developed, in no acute distress. HEENT: normal. Neck: Supple, no JVD, carotid bruits, or masses. Cardiac: RRR, no murmurs, rubs, or gallops. No clubbing, cyanosis, edema.  Radials/DP/PT 2+ and equal bilaterally.  Respiratory:  Respirations regular and unlabored, clear to auscultation bilaterally. GI: Soft, nontender, nondistended, BS + x 4. MS: no deformity or atrophy. Skin: warm and dry, no rash. Neuro:  Strength and sensation are intact. Psych: Normal affect.  Accessory Clinical Findings    Recent Labs: 04/24/2020: ALT 16 09/04/2020: BUN 17; Creat 1.10; Potassium 4.6; Sodium 140   Recent Lipid Panel    Component Value Date/Time   CHOL 122 04/24/2020 1337   CHOL 110 07/15/2018 0922   TRIG 138 04/24/2020 1337   HDL 37 (L) 04/24/2020 1337   HDL 35 (L) 07/15/2018 0922   CHOLHDL 3.3 04/24/2020 1337   LDLCALC 63 04/24/2020 1337    ECG personally reviewed by me today-none today.  Echocardiogram 04/14/2018  Study Conclusions   - Left ventricle: The cavity size was normal. Systolic function was    normal. The estimated ejection fraction was in the range of 60%    to 65%. Normal mitral inflow pattern, however average E/e&' is    >15, suggesting elevation in LV filling pressures.  - Regional wall motion abnormality: Mild hypokinesis of the    anterior and septal wall of the apical myocardium.  - Aortic valve: There was trivial regurgitation. Mean gradient (S):    6 mm Hg. Valve area (VTI): 1.86 cm^2.  Valve area (Vmax): 1.88    cm^2. Valve area (Vmean): 2.22 cm^2.  - Mitral valve: There was trivial regurgitation.  - Left atrium: The atrium was mildly enlarged.  - Right ventricle: The cavity size was normal. Wall thickness was    normal. Systolic function was normal.  - Tricuspid valve: There was trivial regurgitation.  - Inferior vena cava: The vessel was normal in size. The    respirophasic diameter changes were in the normal range (= 50%),    consistent with normal  central venous pressure.  - Pericardium, extracardiac: There was no pericardial effusion.   Impressions:   - Normal left ventricular function with mild apical hypokinesis. EF    60-65%.   Cardiac catheterization 08/29/2019 Prox Cx to Mid Cx lesion is 75% stenosed. Moderate calcium by IVUS. A drug-eluting stent was successfully placed using a SYNERGY XD 3.0X20. Stent optimized with IVUS and postdilated with a 3.5 mm. Post intervention, there is a 0% residual stenosis. 2nd Diag lesion is 60% stenosed. 2nd Mrg lesion is 100% stenosed. Left to left collaterals. The left ventricular systolic function is normal. The left ventricular ejection fraction is 55-65% by visual estimate. LV end diastolic pressure is normal. There is no aortic valve stenosis.   Continue dual antiplatelet for at least 6 months and consider clopidogrel monotherapy after that given CTO of large OM.  Continue aggressive secondary prevention.  Diagnostic Dominance: Co-dominant    Intervention      Assessment & Plan   1.  Chest pressure/hypotension-normotensive today.  Contacted nurse triage line on 11/21/2020 and reported that he was having low blood pressures after exercise.  Continues to notice lightheadedness and low blood pressure after exercise. Decrease amlodipine to 2.5 mg daily Increase p.o. hydration Lower extremity support stockings Slightly increased salt diet Repeat Echocardiogram  Coronary artery disease-no chest pain today.   Cardiac catheterization 2/21 which showed 75% circumflex, 60% second diagonal, occluded second marginal, and normal left ventricular ejection fraction.  He had PCI of his circumflex with DES x1.  Continue metoprolol, pravastatin, aspirin, amlodipine Heart healthy low-sodium diet-salty 6 given Increase physical activity as tolerated  Essential hypertension-BP today 128/54.  Well-controlled on. Decrease amlodipine to 2.5 mg Continue metoprolol Heart healthy low-sodium diet-salty 6 given Increase physical activity as tolerated Maintain b/p log  Hyperlipidemia-04/24/2020: Cholesterol 122; HDL 37; LDL Cholesterol (Calc) 63; Triglycerides 138 Continue pravastatin, aspirin, omega-3 Heart healthy low-sodium high-fiber diet Increase physical activity as tolerated  Disposition: Follow-up with Dr. Stanford Breed in 1 month.  Jossie Ng. Danise Dehne NP-C    12/17/2020, 3:09 PM [place] Office [phone] Fax [phone]  Notice: This dictation was prepared with Dragon dictation along with smaller phrase technology. Any transcriptional errors that result from this process are unintentional and may not be corrected upon review.  I spent 15 minutes examining this patient, reviewing medications, and using patient centered shared decision making involving her cardiac care.  Prior to her visit I spent greater than 20 minutes reviewing her past medical history,  medications, and prior cardiac tests.

## 2020-12-17 ENCOUNTER — Other Ambulatory Visit: Payer: Self-pay

## 2020-12-17 ENCOUNTER — Ambulatory Visit: Payer: Medicare Other | Admitting: General Practice

## 2020-12-17 ENCOUNTER — Encounter: Payer: Self-pay | Admitting: General Practice

## 2020-12-17 VITALS — BP 128/54 | HR 71 | Ht 66.0 in | Wt 159.8 lb

## 2020-12-17 DIAGNOSIS — I10 Essential (primary) hypertension: Secondary | ICD-10-CM | POA: Diagnosis not present

## 2020-12-17 DIAGNOSIS — D320 Benign neoplasm of cerebral meninges: Secondary | ICD-10-CM | POA: Insufficient documentation

## 2020-12-17 DIAGNOSIS — I959 Hypotension, unspecified: Secondary | ICD-10-CM

## 2020-12-17 DIAGNOSIS — E7800 Pure hypercholesterolemia, unspecified: Secondary | ICD-10-CM

## 2020-12-17 DIAGNOSIS — R072 Precordial pain: Secondary | ICD-10-CM

## 2020-12-17 DIAGNOSIS — I2510 Atherosclerotic heart disease of native coronary artery without angina pectoris: Secondary | ICD-10-CM

## 2020-12-17 MED ORDER — AMLODIPINE BESYLATE 5 MG PO TABS: 5.0000 mg | ORAL_TABLET | Freq: Every day | ORAL | 3 refills | Status: DC

## 2020-12-17 NOTE — Patient Instructions (Signed)
Medication Instructions:  DECREASE AMLODIPINE 2.5MG  DAILY *If you need a refill on your cardiac medications before your next appointment, please call your pharmacy*  Lab Work: NONE If you have labs (blood work) drawn today and your tests are completely normal, you will receive your results only by:  [facility] (if you have MyChart) OR A paper copy in the mail.  If you have any lab test that is abnormal or we need to change your treatment, we will call you to review the results. You may go to any [facility] that is convenient for you however, we do have a lab in our office that is able to assist you. You DO NOT need an appointment for our lab. The lab is open 8:00am and closes at 4:00pm. Lunch 12:45 - 1:45pm.  Testing/Procedures: Echocardiogram - Your physician has requested that you have an echocardiogram. Echocardiography is a painless test that uses sound waves to create images of your heart. It provides your doctor with information about the size and shape of your heart and how well your heart's chambers and valves are working. This procedure takes approximately one hour. There are no restrictions for this procedure. This will be performed at our [facility] location - [address], [address].  Special Instructions TAKE AND LOG YOUR BLOOD PRESSURE AT LEAST 1 HOUR AFTER TAKING YOUR MEDICATION  Follow-Up: Your next appointment:  1-2 month(s) In Person with Kirk Ruths, MD OR IF UNAVAILABLE H. Rivera Colon, FNP-C   At [facility], you and your health needs are our priority.  As part of our continuing mission to provide you with exceptional heart care, we have created designated Provider Care Teams.  These Care Teams include your primary Cardiologist (physician) and Advanced Practice Providers (APPs -  Physician Assistants and Nurse Practitioners) who all work together to provide you with the care you need, when you need it.  We recommend signing up for the patient portal called "MyChart".   Sign up information is provided on this After Visit Summary.  MyChart is used to connect with patients for Virtual Visits (Telemedicine).  Patients are able to view lab/test results, encounter notes, upcoming appointments, etc.  Non-urgent messages can be sent to your provider as well.   To learn more about what you can do with MyChart, go to [email or web address].

## 2021-01-16 ENCOUNTER — Ambulatory Visit (HOSPITAL_COMMUNITY): Payer: Medicare Other | Attending: Cardiology

## 2021-01-16 ENCOUNTER — Other Ambulatory Visit: Payer: Self-pay

## 2021-01-16 DIAGNOSIS — R072 Precordial pain: Secondary | ICD-10-CM

## 2021-01-16 DIAGNOSIS — I959 Hypotension, unspecified: Secondary | ICD-10-CM

## 2021-01-16 LAB — ECHOCARDIOGRAM COMPLETE
AR max vel: 2.01 cm2
AV Area VTI: 2.03 cm2
AV Area mean vel: 1.99 cm2
AV Mean grad: 9 mmHg
AV Peak grad: 15.2 mmHg
Ao pk vel: 1.95 m/s
Area-P 1/2: 2.99 cm2
S' Lateral: 2.7 cm

## 2021-02-17 NOTE — Progress Notes (Signed)
HPI: FU coronary artery disease; cath in 2004 showing occlusion of OM 1 treated medically. Carotid Dopplers October 2019 showed no hemodynamically significant stenosis. Nuclear study January 2021 showed ejection fraction 64%, small area of apical lateral ischemia. Cardiac catheterization February 2021 showed 75% circumflex, 60% second diagonal, occluded second marginal, normal LV function. Patient had PCI of circumflex with drug-eluting stent.  Lipitor changed to Crestor at previous office visit due to myalgias.  Last echocardiogram July 2022 showed normal LV function, grade 1 diastolic dysfunction, very mild aortic stenosis with mean gradient 9 mmHg.  Patient seen June 2022 with episodes of hypotension following exercise and amlodipine was decreased.  Since last seen, he has some dyspnea with more vigorous activities.  No orthopnea, PND, pedal edema, chest pain or syncope.  His blood pressure has been running somewhat low by his report.  Current Outpatient Medications  Medication Sig Dispense Refill   amLODipine (NORVASC) 5 MG tablet Take 1 tablet (5 mg total) by mouth daily. 90 tablet 3   Ascorbic Acid (VITAMIN C) 1000 MG tablet Take 1,000 mg by mouth daily.     aspirin EC 81 MG tablet Take 81 mg by mouth daily.     diazepam (VALIUM) 2 MG tablet Take 2 mg by mouth daily as needed (dizziness).      fish oil-omega-3 fatty acids 1000 MG capsule Take 1,200 mg by mouth 4 (four) times daily.     imiquimod (ALDARA) 5 % cream Apply 1 application topically daily as needed (Joint pain).      insulin lispro (HUMALOG) 100 UNIT/ML injection Inject 8-14 Units into the skin See admin instructions. Take 8-14 units in the morning based on blood glucose Take 12-18 in the evening Based on blood glucose Mix with Humulin N     insulin NPH Human (NOVOLIN N) 100 UNIT/ML injection Inject 12-18 Units into the skin See admin instructions. Take 12-18 units in the morning based on blood glucose Take 16-20 units in the  evening based on blood glucose Mix with Humalog     loratadine (CLARITIN) 10 MG tablet Take 10 mg by mouth daily.     metoprolol succinate (TOPROL-XL) 50 MG 24 hr tablet Take 1 tablet (50 mg total) by mouth daily. 90 tablet 3   Multiple Vitamins-Minerals (CENTRUM SILVER) tablet Take 1 tablet by mouth daily.     mupirocin ointment (BACTROBAN) 2 % Apply 1 application topically daily as needed (Skin irritation).      nitroGLYCERIN (NITROSTAT) 0.4 MG SL tablet Place 1 tablet (0.4 mg total) under the tongue every 5 (five) minutes as needed for chest pain (chest pain). 25 tablet 4   OVER THE COUNTER MEDICATION Take 1 capsule by mouth 2 (two) times daily. oil of oregano     pantoprazole (PROTONIX) 40 MG tablet Take 40 mg by mouth daily.     polyethylene glycol powder (GLYCOLAX/MIRALAX) 17 GM/SCOOP powder Take by mouth.     pravastatin (PRAVACHOL) 40 MG tablet Take 1 tablet (40 mg total) by mouth every evening. 90 tablet 3   sucralfate (CARAFATE) 1 g tablet Take by mouth.     tamsulosin (FLOMAX) 0.4 MG CAPS capsule Take 0.4 mg by mouth at bedtime.      zolpidem (AMBIEN) 10 MG tablet Take 10 mg by mouth at bedtime.     No current facility-administered medications for this visit.     Past Medical History:  Diagnosis Date   Atherosclerotic coronary vascular disease    SINGLE  VESSEL OCCULSIVE   Diabetes mellitus    Hyperlipidemia    Hypertension    IHD (ischemic heart disease)    SOB (shortness of breath) on exertion     Past Surgical History:  Procedure Laterality Date   CARDIAC CATHETERIZATION  08/03/2002   NORMAL LEFT VENTRICULAR SIZE AND EXCELLENT CONTRACTILITY WITH NO SEGMENTAL WALL ABNORMALITIES. EF 70-75%   CARDIAC CATHETERIZATION  08/29/2019   CORONARY STENT INTERVENTION N/A 08/29/2019   Procedure: CORONARY STENT INTERVENTION;  Surgeon: Jettie Booze, MD;  Location: [place] [facility];  Service: Cardiovascular;  Laterality: N/A;   CORONARY STENT PLACEMENT  08/29/2019   EYE  SURGERY     INTRAVASCULAR ULTRASOUND/IVUS N/A 08/29/2019   Procedure: Intravascular Ultrasound/IVUS;  Surgeon: Jettie Booze, MD;  Location: [place] [facility];  Service: Cardiovascular;  Laterality: N/A;   LEFT HEART CATH AND CORONARY ANGIOGRAPHY N/A 08/29/2019   Procedure: LEFT HEART CATH AND CORONARY ANGIOGRAPHY;  Surgeon: Jettie Booze, MD;  Location: [place] [facility];  Service: Cardiovascular;  Laterality: N/A;    Social History   Socioeconomic History   Marital status: Married    Spouse name: Not on file   Number of children: Not on file   Years of education: Not on file   Highest education level: Not on file  Occupational History   Not on file  Tobacco Use   Smoking status: Never   Smokeless tobacco: Never  Vaping Use   Vaping Use: Never used  Substance and Sexual Activity   Alcohol use: No   Drug use: No   Sexual activity: Not on file  Other Topics Concern   Not on file  Social History Narrative   Not on file   Social Determinants of Health   Financial Resource Strain: Not on file  Food Insecurity: Not on file  Transportation Needs: Not on file  Physical Activity: Not on file  Stress: Not on file  Social Connections: Not on file  Intimate Partner Violence: Not on file    Family History  Problem Relation Age of Onset   Arthritis Mother    Heart disease Father     ROS: no fevers or chills, productive cough, hemoptysis, dysphasia, odynophagia, melena, hematochezia, dysuria, hematuria, rash, seizure activity, orthopnea, PND, pedal edema, claudication. Remaining systems are negative.  Physical Exam: Well-developed well-nourished in no acute distress.  Skin is warm and dry.  HEENT is normal.  Neck is supple.  Chest is clear to auscultation with normal expansion.  Cardiovascular exam is regular rate and rhythm.  Abdominal exam nontender or distended. No masses palpated. Extremities show no edema. neuro grossly intact  A/P  1 coronary  artery disease-patient denies chest pain.  Plan to continue aspirin and statin.  2 hypertension-patient's blood pressure by report is running low at home.  Decrease Toprol to 25 mg daily and follow.  3 hyperlipidemia-continue pravastatin.  Patient did not tolerate high-dose Lipitor or Crestor previously.  Check lipids and liver.  Kirk Ruths, MD

## 2021-02-26 ENCOUNTER — Ambulatory Visit: Payer: Medicare Other | Admitting: Cardiology

## 2021-02-26 ENCOUNTER — Other Ambulatory Visit: Payer: Self-pay

## 2021-02-26 ENCOUNTER — Encounter: Payer: Self-pay | Admitting: Cardiology

## 2021-02-26 VITALS — BP 135/59 | HR 65 | Ht 66.0 in | Wt 154.1 lb

## 2021-02-26 DIAGNOSIS — E7800 Pure hypercholesterolemia, unspecified: Secondary | ICD-10-CM | POA: Diagnosis not present

## 2021-02-26 DIAGNOSIS — I2510 Atherosclerotic heart disease of native coronary artery without angina pectoris: Secondary | ICD-10-CM

## 2021-02-26 DIAGNOSIS — I10 Essential (primary) hypertension: Secondary | ICD-10-CM | POA: Diagnosis not present

## 2021-02-26 MED ORDER — AMLODIPINE BESYLATE 2.5 MG PO TABS: 2.5000 mg | ORAL_TABLET | Freq: Every day | ORAL | 3 refills | Status: DC

## 2021-02-26 MED ORDER — METOPROLOL SUCCINATE ER 25 MG PO TB24: 25.0000 mg | ORAL_TABLET | Freq: Every day | ORAL | 3 refills | Status: DC

## 2021-02-26 NOTE — Addendum Note (Signed)
Addended by: Cristopher Estimable on: 02/26/2021 04:55 PM   Modules accepted: Orders

## 2021-02-26 NOTE — Patient Instructions (Addendum)
  DECREASE METOPROLOL TO 25 MG ONCE DAILY=1/2 OF THE 50 MG TABLET ONCE DAILY  Your physician recommends that you return for lab work FASTING   Follow-Up: At [facility], you and your health needs are our priority.  As part of our continuing mission to provide you with exceptional heart care, we have created designated Provider Care Teams.  These Care Teams include your primary Cardiologist (physician) and Advanced Practice Providers (APPs -  Physician Assistants and Nurse Practitioners) who all work together to provide you with the care you need, when you need it.  We recommend signing up for the patient portal called "MyChart".  Sign up information is provided on this After Visit Summary.  MyChart is used to connect with patients for Virtual Visits (Telemedicine).  Patients are able to view lab/test results, encounter notes, upcoming appointments, etc.  Non-urgent messages can be sent to your provider as well.   To learn more about what you can do with MyChart, go to [email or web address].    Your next appointment:   6 month(s)  The format for your next appointment:   In Person  Provider:   Kirk Ruths, MD

## 2021-03-06 ENCOUNTER — Ambulatory Visit: Payer: Medicare Other | Admitting: General Practice

## 2021-03-13 ENCOUNTER — Other Ambulatory Visit: Payer: Self-pay

## 2021-03-13 DIAGNOSIS — E7800 Pure hypercholesterolemia, unspecified: Secondary | ICD-10-CM

## 2021-03-13 LAB — LIPID PANEL
Cholesterol: 136 mg/dL (ref <200)
HDL: 40 mg/dL (ref >=40)
LDL Cholesterol (Calc): 83 mg/dL (calc)
Non-HDL Cholesterol (Calc): 96 mg/dL (calc) (ref <130)
Total CHOL/HDL Ratio: 3.4 (calc) (ref <5.0)
Triglycerides: 57 mg/dL (ref <150)

## 2021-03-13 LAB — HEPATIC FUNCTION PANEL
AG Ratio: 1.7 (calc) (ref 1.0–2.5)
ALT: 19 U/L (ref 9–46)
AST: 21 U/L (ref 10–35)
Albumin: 4.1 g/dL (ref 3.6–5.1)
Alkaline phosphatase (APISO): 67 U/L (ref 35–144)
Bilirubin, Direct: 0.1 mg/dL (ref 0.0–0.2)
Globulin: 2.4 g/dL (calc) (ref 1.9–3.7)
Indirect Bilirubin: 0.6 mg/dL (calc) (ref 0.2–1.2)
Total Bilirubin: 0.7 mg/dL (ref 0.2–1.2)
Total Protein: 6.5 g/dL (ref 6.1–8.1)

## 2021-03-13 MED ORDER — EZETIMIBE 10 MG PO TABS: 10.0000 mg | ORAL_TABLET | Freq: Every day | ORAL | 3 refills | Status: DC

## 2021-03-26 ENCOUNTER — Other Ambulatory Visit: Payer: Self-pay | Admitting: *Deleted

## 2021-03-26 DIAGNOSIS — E7800 Pure hypercholesterolemia, unspecified: Secondary | ICD-10-CM

## 2021-08-05 ENCOUNTER — Encounter: Payer: Self-pay | Admitting: *Deleted

## 2021-08-14 NOTE — Progress Notes (Signed)
HPI: FU coronary artery disease; cath in 2004 showing occlusion of OM 1 treated medically. Carotid Dopplers October 2019 showed no hemodynamically significant stenosis. Nuclear study January 2021 showed ejection fraction 64%, small area of apical lateral ischemia. Cardiac catheterization February 2021 showed 75% circumflex, 60% second diagonal, occluded second marginal, normal LV function. Patient had PCI of circumflex with drug-eluting stent.  Lipitor changed to Crestor at previous office visit due to myalgias.  Last echocardiogram July 2022 showed normal LV function, grade 1 diastolic dysfunction, very mild aortic stenosis with mean gradient 9 mmHg.  Patient seen June 2022 with episodes of hypotension following exercise and amlodipine was decreased.  Since last seen, he has dyspnea with climbing hills but otherwise no orthopnea, PND, pedal edema, palpitations, syncope or exertional chest pain.  Current Outpatient Medications  Medication Sig Dispense Refill   Ascorbic Acid (VITAMIN C) 1000 MG tablet Take 1,000 mg by mouth daily.     aspirin EC 81 MG tablet Take 81 mg by mouth daily.     diazepam (VALIUM) 2 MG tablet Take 2 mg by mouth daily as needed (dizziness).      ezetimibe (ZETIA) 10 MG tablet Take 1 tablet (10 mg total) by mouth daily. 90 tablet 3   fish oil-omega-3 fatty acids 1000 MG capsule Take 1,200 mg by mouth 4 (four) times daily.     imiquimod (ALDARA) 5 % cream Apply 1 application topically daily as needed (Joint pain).      insulin lispro (HUMALOG) 100 UNIT/ML injection Inject 8-14 Units into the skin See admin instructions. Take 8-14 units in the morning based on blood glucose Take 12-18 in the evening Based on blood glucose Mix with Humulin N     insulin NPH Human (NOVOLIN N) 100 UNIT/ML injection Inject 12-18 Units into the skin See admin instructions. Take 12-18 units in the morning based on blood glucose Take 16-20 units in the evening based on blood glucose Mix with  Humalog     loratadine (CLARITIN) 10 MG tablet Take 10 mg by mouth daily.     metoprolol succinate (TOPROL-XL) 25 MG 24 hr tablet Take 1 tablet (25 mg total) by mouth daily. 90 tablet 3   Multiple Vitamins-Minerals (CENTRUM SILVER) tablet Take 1 tablet by mouth daily.     mupirocin ointment (BACTROBAN) 2 % Apply 1 application topically daily as needed (Skin irritation).      nitroGLYCERIN (NITROSTAT) 0.4 MG SL tablet Place 1 tablet (0.4 mg total) under the tongue every 5 (five) minutes as needed for chest pain (chest pain). 25 tablet 4   OVER THE COUNTER MEDICATION Take 1 capsule by mouth 2 (two) times daily. oil of oregano     pantoprazole (PROTONIX) 40 MG tablet Take 40 mg by mouth daily.     polyethylene glycol powder (GLYCOLAX/MIRALAX) 17 GM/SCOOP powder Take by mouth.     sucralfate (CARAFATE) 1 g tablet Take by mouth.     tamsulosin (FLOMAX) 0.4 MG CAPS capsule Take 0.4 mg by mouth at bedtime.      zolpidem (AMBIEN) 10 MG tablet Take 10 mg by mouth at bedtime.     amLODipine (NORVASC) 2.5 MG tablet Take 1 tablet (2.5 mg total) by mouth daily. 90 tablet 3   pravastatin (PRAVACHOL) 40 MG tablet Take 1 tablet (40 mg total) by mouth every evening. 90 tablet 3   No current facility-administered medications for this visit.     Past Medical History:  Diagnosis Date  Atherosclerotic coronary vascular disease    SINGLE VESSEL OCCULSIVE   Diabetes mellitus    Hyperlipidemia    Hypertension    IHD (ischemic heart disease)    SOB (shortness of breath) on exertion     Past Surgical History:  Procedure Laterality Date   CARDIAC CATHETERIZATION  08/03/2002   NORMAL LEFT VENTRICULAR SIZE AND EXCELLENT CONTRACTILITY WITH NO SEGMENTAL WALL ABNORMALITIES. EF 70-75%   CARDIAC CATHETERIZATION  08/29/2019   CORONARY STENT INTERVENTION N/A 08/29/2019   Procedure: CORONARY STENT INTERVENTION;  Surgeon: Jettie Booze, MD;  Location: [facility];  Service: Cardiovascular;  Laterality:  N/A;   CORONARY STENT PLACEMENT  08/29/2019   EYE SURGERY     INTRAVASCULAR ULTRASOUND/IVUS N/A 08/29/2019   Procedure: Intravascular Ultrasound/IVUS;  Surgeon: Jettie Booze, MD;  Location: [facility];  Service: Cardiovascular;  Laterality: N/A;   LEFT HEART CATH AND CORONARY ANGIOGRAPHY N/A 08/29/2019   Procedure: LEFT HEART CATH AND CORONARY ANGIOGRAPHY;  Surgeon: Jettie Booze, MD;  Location: [facility];  Service: Cardiovascular;  Laterality: N/A;    Social History   Socioeconomic History   Marital status: Married    Spouse name: Not on file   Number of children: Not on file   Years of education: Not on file   Highest education level: Not on file  Occupational History   Not on file  Tobacco Use   Smoking status: Never   Smokeless tobacco: Never  Vaping Use   Vaping Use: Never used  Substance and Sexual Activity   Alcohol use: No   Drug use: No   Sexual activity: Not on file  Other Topics Concern   Not on file  Social History Narrative   Not on file   Social Determinants of Health   Financial Resource Strain: Not on file  Food Insecurity: Not on file  Transportation Needs: Not on file  Physical Activity: Not on file  Stress: Not on file  Social Connections: Not on file  Intimate Partner Violence: Not on file    Family History  Problem Relation Age of Onset   Arthritis Mother    Heart disease Father     ROS: no fevers or chills, productive cough, hemoptysis, dysphasia, odynophagia, melena, hematochezia, dysuria, hematuria, rash, seizure activity, orthopnea, PND, pedal edema, claudication. Remaining systems are negative.  Physical Exam: Well-developed well-nourished in no acute distress.  Skin is warm and dry.  HEENT is normal.  Neck is supple.  Chest is clear to auscultation with normal expansion.  Cardiovascular exam is regular rate and rhythm.  Abdominal exam nontender or distended. No masses palpated. Extremities show no  edema. neuro grossly intact  A/P  1 coronary artery disease-patient denies chest pain.  Plan to continue medical therapy with aspirin and statin.  2 hypertension-blood pressure has been mildly elevated at home.  Increase amlodipine to 5 mg daily and follow.  3 hyperlipidemia-continue pravastatin.  Note he did not tolerate higher doses of Crestor or Lipitor previously.  Kirk Ruths, MD

## 2021-08-27 ENCOUNTER — Ambulatory Visit: Payer: Medicare Other | Admitting: Cardiology

## 2021-08-27 ENCOUNTER — Other Ambulatory Visit: Payer: Self-pay

## 2021-08-27 ENCOUNTER — Encounter: Payer: Self-pay | Admitting: Cardiology

## 2021-08-27 VITALS — BP 140/72 | HR 68 | Ht 66.0 in | Wt 162.1 lb

## 2021-08-27 DIAGNOSIS — I2510 Atherosclerotic heart disease of native coronary artery without angina pectoris: Secondary | ICD-10-CM | POA: Diagnosis not present

## 2021-08-27 DIAGNOSIS — I10 Essential (primary) hypertension: Secondary | ICD-10-CM | POA: Diagnosis not present

## 2021-08-27 DIAGNOSIS — E7800 Pure hypercholesterolemia, unspecified: Secondary | ICD-10-CM | POA: Diagnosis not present

## 2021-08-27 MED ORDER — AMLODIPINE BESYLATE 5 MG PO TABS: 5.0000 mg | ORAL_TABLET | Freq: Every day | ORAL | 3 refills | Status: DC

## 2021-08-27 NOTE — Patient Instructions (Signed)
Medication Instructions:   INCREASE AMLODIPINE TO 5 MG ONCE DAILY= 2 OF THE 2.5 MG TABLETS ONCE DAILY  *If you need a refill on your cardiac medications before your next appointment, please call your pharmacy*   Follow-Up: At [facility], you and your health needs are our priority.  As part of our continuing mission to provide you with exceptional heart care, we have created designated Provider Care Teams.  These Care Teams include your primary Cardiologist (physician) and Advanced Practice Providers (APPs -  Physician Assistants and Nurse Practitioners) who all work together to provide you with the care you need, when you need it.  We recommend signing up for the patient portal called "MyChart".  Sign up information is provided on this After Visit Summary.  MyChart is used to connect with patients for Virtual Visits (Telemedicine).  Patients are able to view lab/test results, encounter notes, upcoming appointments, etc.  Non-urgent messages can be sent to your provider as well.   To learn more about what you can do with MyChart, go to [email or web address].    Your next appointment:   6 month(s)  The format for your next appointment:   In Person  Provider:   Kirk Ruths, MD

## 2021-11-15 ENCOUNTER — Other Ambulatory Visit: Payer: Self-pay | Admitting: Cardiology

## 2021-11-15 DIAGNOSIS — E7800 Pure hypercholesterolemia, unspecified: Secondary | ICD-10-CM

## 2022-02-12 ENCOUNTER — Other Ambulatory Visit: Payer: Self-pay | Admitting: Cardiology

## 2022-02-12 DIAGNOSIS — I2510 Atherosclerotic heart disease of native coronary artery without angina pectoris: Secondary | ICD-10-CM

## 2022-02-12 NOTE — Progress Notes (Signed)
HPI: FU coronary artery disease; cath in 2004 showing occlusion of OM 1 treated medically. Carotid Dopplers October 2019 showed no hemodynamically significant stenosis. Nuclear study January 2021 showed ejection fraction 64%, small area of apical lateral ischemia. Cardiac catheterization February 2021 showed 75% circumflex, 60% second diagonal, occluded second marginal, normal LV function. Patient had PCI of circumflex with drug-eluting stent.  Lipitor changed to Crestor at previous office visit due to myalgias.  Last echocardiogram July 2022 showed normal LV function, grade 1 diastolic dysfunction, very mild aortic stenosis with mean gradient 9 mmHg.  Patient seen June 2022 with episodes of hypotension following exercise and amlodipine was decreased.  Since last seen, there is dyspnea with more vigorous activities but not routine activities.  No orthopnea, PND, pedal edema, exertional chest pain or syncope.  Current Outpatient Medications  Medication Sig Dispense Refill   amLODipine (NORVASC) 2.5 MG tablet Take 2.5 mg by mouth daily.     Ascorbic Acid (VITAMIN C) 1000 MG tablet Take 1,000 mg by mouth daily.     aspirin EC 81 MG tablet Take 81 mg by mouth daily.     diazepam (VALIUM) 2 MG tablet Take 2 mg by mouth daily as needed (dizziness).      doxepin (SINEQUAN) 10 MG capsule Take by mouth.     ezetimibe (ZETIA) 10 MG tablet Take 1 tablet (10 mg total) by mouth daily. 90 tablet 3   finasteride (PROSCAR) 5 MG tablet Take 5 mg by mouth daily.     fish oil-omega-3 fatty acids 1000 MG capsule Take 1,200 mg by mouth 4 (four) times daily.     imiquimod (ALDARA) 5 % cream Apply 1 application topically daily as needed (Joint pain).      insulin lispro (HUMALOG) 100 UNIT/ML injection Inject 8-14 Units into the skin See admin instructions. Take 8-14 units in the morning based on blood glucose Take 12-18 in the evening Based on blood glucose Mix with Humulin N     insulin NPH Human (NOVOLIN N) 100  UNIT/ML injection Inject 12-18 Units into the skin See admin instructions. Take 12-18 units in the morning based on blood glucose Take 16-20 units in the evening based on blood glucose Mix with Humalog     loratadine (CLARITIN) 10 MG tablet Take 10 mg by mouth daily.     methocarbamol (ROBAXIN) 500 MG tablet Take 500 mg by mouth 3 (three) times daily.     metoprolol succinate (TOPROL-XL) 25 MG 24 hr tablet Take 1 tablet (25 mg total) by mouth daily. 90 tablet 3   Multiple Vitamins-Minerals (CENTRUM SILVER) tablet Take 1 tablet by mouth daily.     mupirocin ointment (BACTROBAN) 2 % Apply 1 application topically daily as needed (Skin irritation).      nitroGLYCERIN (NITROSTAT) 0.4 MG SL tablet Place 1 tablet (0.4 mg total) under the tongue every 5 (five) minutes as needed for chest pain (chest pain). 25 tablet 4   pantoprazole (PROTONIX) 40 MG tablet Take 40 mg by mouth daily.     polyethylene glycol powder (GLYCOLAX/MIRALAX) 17 GM/SCOOP powder Take by mouth.     pravastatin (PRAVACHOL) 40 MG tablet Take 1 tablet (40 mg total) by mouth every evening. 90 tablet 3   sucralfate (CARAFATE) 1 g tablet Take by mouth.     tamsulosin (FLOMAX) 0.4 MG CAPS capsule Take 0.4 mg by mouth at bedtime.      Vitamin D, Ergocalciferol, (DRISDOL) 1.25 MG (50000 UNIT) CAPS capsule Take  50,000 Units by mouth once a week.     zolpidem (AMBIEN) 10 MG tablet Take 10 mg by mouth at bedtime.     OVER THE COUNTER MEDICATION Take 1 capsule by mouth 2 (two) times daily. oil of oregano (Patient not taking: Reported on 02/25/2022)     No current facility-administered medications for this visit.     Past Medical History:  Diagnosis Date   Atherosclerotic coronary vascular disease    SINGLE VESSEL OCCULSIVE   Diabetes mellitus    Hyperlipidemia    Hypertension    IHD (ischemic heart disease)    SOB (shortness of breath) on exertion     Past Surgical History:  Procedure Laterality Date   CARDIAC CATHETERIZATION   08/03/2002   NORMAL LEFT VENTRICULAR SIZE AND EXCELLENT CONTRACTILITY WITH NO SEGMENTAL WALL ABNORMALITIES. EF 70-75%   CARDIAC CATHETERIZATION  08/29/2019   CORONARY STENT INTERVENTION N/A 08/29/2019   Procedure: CORONARY STENT INTERVENTION;  Surgeon: Jettie Booze, MD;  Location: [facility];  Service: Cardiovascular;  Laterality: N/A;   CORONARY STENT PLACEMENT  08/29/2019   EYE SURGERY     INTRAVASCULAR ULTRASOUND/IVUS N/A 08/29/2019   Procedure: Intravascular Ultrasound/IVUS;  Surgeon: Jettie Booze, MD;  Location: [facility];  Service: Cardiovascular;  Laterality: N/A;   LEFT HEART CATH AND CORONARY ANGIOGRAPHY N/A 08/29/2019   Procedure: LEFT HEART CATH AND CORONARY ANGIOGRAPHY;  Surgeon: Jettie Booze, MD;  Location: [facility];  Service: Cardiovascular;  Laterality: N/A;    Social History   Socioeconomic History   Marital status: Married    Spouse name: Not on file   Number of children: Not on file   Years of education: Not on file   Highest education level: Not on file  Occupational History   Not on file  Tobacco Use   Smoking status: Never   Smokeless tobacco: Never  Vaping Use   Vaping Use: Never used  Substance and Sexual Activity   Alcohol use: No   Drug use: No   Sexual activity: Not on file  Other Topics Concern   Not on file  Social History Narrative   Not on file   Social Determinants of Health   Financial Resource Strain: Not on file  Food Insecurity: Not on file  Transportation Needs: Not on file  Physical Activity: Not on file  Stress: Not on file  Social Connections: Not on file  Intimate Partner Violence: Not on file    Family History  Problem Relation Age of Onset   Arthritis Mother    Heart disease Father     ROS: Hip arthralgias but no fevers or chills, productive cough, hemoptysis, dysphasia, odynophagia, melena, hematochezia, dysuria, hematuria, rash, seizure activity, orthopnea, PND, pedal  edema, claudication. Remaining systems are negative.  Physical Exam: Well-developed well-nourished in no acute distress.  Skin is warm and dry.  HEENT is normal.  Neck is supple.  Chest is clear to auscultation with normal expansion.  Cardiovascular exam is regular rate and rhythm.  Abdominal exam nontender or distended. No masses palpated. Extremities show no edema. neuro grossly intact  ECG-normal sinus rhythm at a rate of 68, RV conduction delay.  Personally reviewed  A/P  1 coronary artery disease-patient doing well with no exertional chest pain.  Continue aspirin and statin.  2 hyperlipidemia-continue pravastatin at present dose.  He did not tolerate higher doses of Crestor or Lipitor in the past.  3 hypertension-patient's blood pressure is controlled.  Continue present medications.  4 possible mild aortic stenosis-noted on previous echocardiogram.  Will need repeat studies in the future.  Kirk Ruths, MD

## 2022-02-20 ENCOUNTER — Other Ambulatory Visit: Payer: Self-pay | Admitting: Cardiology

## 2022-02-24 ENCOUNTER — Other Ambulatory Visit: Payer: Self-pay | Admitting: Cardiology

## 2022-02-25 ENCOUNTER — Ambulatory Visit: Payer: Medicare Other | Admitting: Cardiology

## 2022-02-25 ENCOUNTER — Encounter: Payer: Self-pay | Admitting: Cardiology

## 2022-02-25 VITALS — BP 124/62 | HR 67 | Ht 66.0 in | Wt 158.0 lb

## 2022-02-25 DIAGNOSIS — I2510 Atherosclerotic heart disease of native coronary artery without angina pectoris: Secondary | ICD-10-CM | POA: Diagnosis not present

## 2022-02-25 DIAGNOSIS — E7800 Pure hypercholesterolemia, unspecified: Secondary | ICD-10-CM | POA: Diagnosis not present

## 2022-02-25 DIAGNOSIS — I10 Essential (primary) hypertension: Secondary | ICD-10-CM | POA: Diagnosis not present

## 2022-02-25 MED ORDER — NITROGLYCERIN 0.4 MG SL SUBL: 0.4000 mg | SUBLINGUAL_TABLET | SUBLINGUAL | 4 refills | Status: DC | PRN

## 2022-02-25 NOTE — Patient Instructions (Signed)

## 2022-03-02 ENCOUNTER — Other Ambulatory Visit: Payer: Self-pay | Admitting: Cardiology

## 2022-03-04 ENCOUNTER — Other Ambulatory Visit: Payer: Self-pay | Admitting: Cardiology

## 2022-05-07 ENCOUNTER — Other Ambulatory Visit: Payer: Self-pay | Admitting: Cardiology

## 2022-05-11 ENCOUNTER — Telehealth: Payer: Self-pay | Admitting: Cardiology

## 2022-05-11 NOTE — Telephone Encounter (Signed)
Spoke with pt, he reports for the last 2-3 weeks he is getting a pinching feeling in the left side of his chest. He reports getting tired sooner and SOB more than previous. He feels like this is similar to the symptoms he had prior to his last stenting. He is not having discomfort now. Follow up appointment scheduled for Wednesday 05/13/22 with NP. Patient voiced understanding to go to the ER if he develops pain.

## 2022-05-11 NOTE — Telephone Encounter (Signed)
Pt c/o Shortness Of Breath: STAT if SOB developed within the last 24 hours or pt is noticeably SOB on the phone  1. Are you currently SOB (can you hear that pt is SOB on the phone)? no  2. How long have you been experiencing SOB? 2-3 weeks   3. Are you SOB when sitting or when up moving around? moving  4. Are you currently experiencing any other symptoms? Pinching in the left side of chest, dizziness

## 2022-05-13 ENCOUNTER — Encounter: Payer: Self-pay | Admitting: Nurse Practitioner

## 2022-05-13 ENCOUNTER — Ambulatory Visit: Payer: Medicare Other | Attending: Nurse Practitioner | Admitting: Nurse Practitioner

## 2022-05-13 VITALS — BP 118/58 | Ht 66.0 in | Wt 165.0 lb

## 2022-05-13 DIAGNOSIS — I25118 Atherosclerotic heart disease of native coronary artery with other forms of angina pectoris: Secondary | ICD-10-CM

## 2022-05-13 DIAGNOSIS — E785 Hyperlipidemia, unspecified: Secondary | ICD-10-CM

## 2022-05-13 DIAGNOSIS — E118 Type 2 diabetes mellitus with unspecified complications: Secondary | ICD-10-CM

## 2022-05-13 DIAGNOSIS — I350 Nonrheumatic aortic (valve) stenosis: Secondary | ICD-10-CM | POA: Diagnosis not present

## 2022-05-13 DIAGNOSIS — I10 Essential (primary) hypertension: Secondary | ICD-10-CM | POA: Diagnosis not present

## 2022-05-13 DIAGNOSIS — Z794 Long term (current) use of insulin: Secondary | ICD-10-CM

## 2022-05-13 NOTE — Progress Notes (Signed)
Office Visit    Patient Name: CAITLIN HILLMER Date of Encounter: 05/13/2022  Primary Care Provider:  Reynold Bowen, MD Primary Cardiologist:  Kirk Ruths, MD  Chief Complaint    71 year old male with a history of CAD, hypertension, hyperlipidemia, mild aortic stenosis, legally blind   and type 2 diabetes who presents for follow-up related to CAD.   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 EF 70-75%   CARDIAC CATHETERIZATION  08/29/2019   CORONARY STENT INTERVENTION N/A 08/29/2019   Procedure: CORONARY STENT INTERVENTION;  Surgeon: Jettie Booze, MD;  Location: [facility];  Service: Cardiovascular;  Laterality: N/A;   CORONARY STENT PLACEMENT  08/29/2019   EYE SURGERY     INTRAVASCULAR ULTRASOUND/IVUS N/A 08/29/2019   Procedure: Intravascular Ultrasound/IVUS;  Surgeon: Jettie Booze, MD;  Location: [facility];  Service: Cardiovascular;  Laterality: N/A;   LEFT HEART CATH AND CORONARY ANGIOGRAPHY N/A 08/29/2019   Procedure: LEFT HEART CATH AND CORONARY ANGIOGRAPHY;  Surgeon: Jettie Booze, MD;  Location: [facility];  Service: Cardiovascular;  Laterality: N/A;    Allergies  No Known Allergies  History of Present Illness    71 year old male with the above past medical history including CAD, hypertension, hyperlipidemia, mild aortic stenosis, legally blind, and type 2 diabetes.   Cardiac catheterization in 2004 showed occluded OM1, this was treated medically.  Stress test in January 2021 showed EF 64%, small area of apical lateral  ischemia.  Repeat cardiac Catheterization in February 2021 showed left circumflex 75%, 60% second diagonal, occluded second marginal, normal LV function.  He had PCI of circumflex with DES.  He did not tolerate higher doses of Crestor or Lipitor and has been maintained on pravastatin.  Echocardiogram in July 2022 showed normal LV function, G1 DD, very mild aortic stenosis, mean gradient 9 mmHg.  He was last seen in the office on 02/25/2022 and was stable from a cardiac standpoint.  He did note some increased dyspnea with more vigorous activities.  He denied chest pain.  He contacted our office on 05/11/2022 and noted a 2-3-week history of a "pinching feeling" in the left side of his chest, increased fatigue and shortness of breath, similar to prior anginal equivalent.  He presents today for follow-up accompanied by his seeing-eye dog, Mel Almond.  Since he contacted our office he notes a slight improvement in the pinching sensation in his chest, though he does continue to note intermittent fleeting "pinches" and dull pain in his left chest.  His symptoms occur off and on throughout the day.  He did take nitroglycerin once with no relief. He also notes decreased activity tolerance, he was able to walk 1 mile today but felt fatigued afterwards.  He is concerned that his symptoms are similar to his prior anginal equivalent.  He is interested in pursuing a repeat stress test.  Home Medications    Current Outpatient Medications  Medication Sig Dispense Refill   amLODipine (NORVASC) 2.5 MG tablet Take 1 tablet (2.5 mg total) by mouth daily. 90 tablet  3   Ascorbic Acid (VITAMIN C) 1000 MG tablet Take 1,000 mg by mouth daily.     aspirin EC 81 MG tablet Take 81 mg by mouth daily.     diazepam (VALIUM) 2 MG tablet Take 2 mg by mouth daily as needed (dizziness).      doxepin (SINEQUAN) 10 MG capsule Take by mouth.     ezetimibe (ZETIA) 10 MG tablet Take 1 tablet (10 mg total) by mouth daily. 90 tablet 3   finasteride  (PROSCAR) 5 MG tablet Take 5 mg by mouth daily.     fish oil-omega-3 fatty acids 1000 MG capsule Take 1,200 mg by mouth 4 (four) times daily.     imiquimod (ALDARA) 5 % cream Apply 1 application topically daily as needed (Joint pain).      insulin lispro (HUMALOG) 100 UNIT/ML injection Inject 8-14 Units into the skin See admin instructions. Take 8-14 units in the morning based on blood glucose Take 12-18 in the evening Based on blood glucose Mix with Humulin N     insulin NPH Human (NOVOLIN N) 100 UNIT/ML injection Inject 12-18 Units into the skin See admin instructions. Take 12-18 units in the morning based on blood glucose Take 16-20 units in the evening based on blood glucose Mix with Humalog     loratadine (CLARITIN) 10 MG tablet Take 10 mg by mouth daily.     methocarbamol (ROBAXIN) 500 MG tablet Take 500 mg by mouth 3 (three) times daily.     metoprolol succinate (TOPROL-XL) 25 MG 24 hr tablet Take 1 tablet (25 mg total) by mouth daily. 90 tablet 3   Multiple Vitamins-Minerals (CENTRUM SILVER) tablet Take 1 tablet by mouth daily.     mupirocin ointment (BACTROBAN) 2 % Apply 1 application topically daily as needed (Skin irritation).      nitroGLYCERIN (NITROSTAT) 0.4 MG SL tablet Place 1 tablet (0.4 mg total) under the tongue every 5 (five) minutes as needed for chest pain (chest pain). 25 tablet 4   OVER THE COUNTER MEDICATION Take 1 capsule by mouth 2 (two) times daily. oil of oregano     pantoprazole (PROTONIX) 40 MG tablet Take 40 mg by mouth daily.     polyethylene glycol powder (GLYCOLAX/MIRALAX) 17 GM/SCOOP powder Take by mouth.     pravastatin (PRAVACHOL) 40 MG tablet Take 1 tablet (40 mg total) by mouth every evening. 90 tablet 3   sucralfate (CARAFATE) 1 g tablet Take by mouth.     tamsulosin (FLOMAX) 0.4 MG CAPS capsule Take 0.4 mg by mouth at bedtime.      Vitamin D, Ergocalciferol, (DRISDOL) 1.25 MG (50000 UNIT) CAPS capsule Take 50,000 Units by mouth once a week.     zolpidem  (AMBIEN) 10 MG tablet Take 10 mg by mouth at bedtime.     No current facility-administered medications for this visit.     Review of Systems    He denies palpitations, pnd, orthopnea, n, v, dizziness, syncope, edema, weight gain, or early satiety. All other systems reviewed and are otherwise negative except as noted above.   Physical Exam    VS:  BP (!) 118/58   Ht '5\' 6"'$  (1.676 m)   Wt 165 lb (74.8 kg)   SpO2 97%   BMI 26.63 kg/m  GEN: Well nourished, well developed, in no acute distress. HEENT: normal. Neck: Supple, no JVD, carotid bruits, or masses. Cardiac: RRR, no murmurs, rubs, or gallops. No clubbing, cyanosis, edema.  Radials/DP/PT 2+ and equal bilaterally.  Respiratory:  Respirations regular and unlabored, clear to auscultation bilaterally. GI: Soft, nontender, nondistended, BS + x 4. MS: no deformity or atrophy. Skin: warm and dry, no rash. Neuro:  Strength and sensation are intact. Psych: Normal affect.  Accessory Clinical Findings    ECG personally reviewed by me today -NSR, 74 bpm- no acute changes.   Lab Results  Component Value Date   WBC 7.4 08/30/2019   HGB 13.2 08/30/2019   HCT 40.0 08/30/2019   MCV 90.3 08/30/2019   PLT 164 08/30/2019   Lab Results  Component Value Date   CREATININE 1.10 09/04/2020   BUN 17 09/04/2020   NA 140 09/04/2020   K 4.6 09/04/2020   CL 105 09/04/2020   CO2 31 09/04/2020   Lab Results  Component Value Date   ALT 19 03/12/2021   AST 21 03/12/2021   ALKPHOS 67 07/15/2018   BILITOT 0.7 03/12/2021   Lab Results  Component Value Date   CHOL 136 03/12/2021   HDL 40 03/12/2021   LDLCALC 83 03/12/2021   TRIG 57 03/12/2021   CHOLHDL 3.4 03/12/2021    Lab Results  Component Value Date   HGBA1C 7.7 (H) 08/29/2019    Assessment & Plan    1. CAD/chest pain/dyspnea on exertion: Cath in February 2021 following abnormal myoview showed left circumflex 75%, 60% second diagonal, occluded second marginal, normal LV  function.  He had PCI of circumflex with DES.  He notes a 2 to 3-week history of a "pinching feeling" in the left side of his chest, increased fatigue and shortness of breath, similar to prior anginal equivalent.  Patient states he is not interested in a repeat stress test.  Through shared decision making, will pursue Lexiscan Myoview for further ischemic evaluation.  Discussed ED precautions.  Continue aspirin, amlodipine, metoprolol, pravastatin, and Zetia.    Of note, in the past, patient has received transportation from [place] in our nuclear department.  She lives in [place] and has been willing to provide him with a ride (patient is blind and does not drive). I spoke with the nuclear medicine department who states they will make a note and work with patient to coordinate transportation.  Patient is aware.  Shared Decision Making/Informed  Consent The risks [chest pain, shortness of breath, cardiac arrhythmias, dizziness, blood pressure fluctuations, myocardial infarction, stroke/transient ischemic attack, nausea, vomiting, allergic reaction, radiation exposure, metallic taste sensation and life-threatening complications (estimated to be 1 in 10,000)], benefits (risk stratification, diagnosing coronary artery disease, treatment guidance) and alternatives of a nuclear stress test were discussed in detail with Mr. Fenter and he agrees to proceed.  2. Hypertension: BP well controlled. Continue current antihypertensive regimen.   3. Hyperlipidemia: LDL was 83 in 03/2021.  He had repeat lipids with his PCP recently.  We will have to request these records.  If LDL remains elevated above goal, would recommend possible referral to lipid clinic pharm D for consideration of PCSK9 inhibitor given intolerance to high intensity statin therapy.  Continue aspirin, pravastatin, Zetia.  4. Aortic stenosis: Echocardiogram in July 2022 showed normal LV function, G1 DD, very mild aortic stenosis, mean gradient 9 mmHg.   He does note decreased activity tolerance, worsening dyspnea on exertion. Euvolemic and well compensated on exam.  Will repeat echocardiogram for ongoing monitoring.  5. Type 2 diabetes: No recent A1C available for review. He in on insulin. Monitored and managed per PCP.   6. Disposition: Follow-up in 6-8 weeks.  Lenna Sciara, NP 05/13/2022, 5:24 PM

## 2022-05-13 NOTE — Patient Instructions (Signed)
Medication Instructions:  No Changes *If you need a refill on your cardiac medications before your next appointment, please call your pharmacy*   Lab Work: No Labs If you have labs (blood work) drawn today and your tests are completely normal, you will receive your results only by: [facility] (if you have MyChart) OR A paper copy in the mail If you have any lab test that is abnormal or we need to change your treatment, we will call you to review the results.   Testing/Procedures: [address], [address]. Your physician has requested that you have an echocardiogram. Echocardiography is a painless test that uses sound waves to create images of your heart. It provides your doctor with information about the size and shape of your heart and how well your heart's chambers and valves are working. This procedure takes approximately one hour. There are no restrictions for this procedure. Please do NOT wear cologne, perfume, aftershave, or lotions (deodorant is allowed). Please arrive 15 minutes prior to your appointment time.    [address], [address]. Your physician has requested that you have a lexiscan myoview. For further information please visit [email or web address]. Please follow instruction sheet, as given.   Follow-Up: At [facility], you and your health needs are our priority.  As part of our continuing mission to provide you with exceptional heart care, we have created designated Provider Care Teams.  These Care Teams include your primary Cardiologist (physician) and Advanced Practice Providers (APPs -  Physician Assistants and Nurse Practitioners) who all work together to provide you with the care you need, when you need it.   Your next appointment:   6-8 week(s)  The format for your next appointment:   In Person  Provider:   Diona Browner, NP   or, Kirk Ruths, MD

## 2022-06-02 ENCOUNTER — Telehealth (HOSPITAL_COMMUNITY): Payer: Self-pay | Admitting: *Deleted

## 2022-06-02 NOTE — Telephone Encounter (Signed)
[employer] letter sent outlining instructions for upcoming stress test on 06/08/22 at 7:15.

## 2022-06-08 ENCOUNTER — Other Ambulatory Visit (HOSPITAL_COMMUNITY): Payer: Medicare Other

## 2022-06-08 ENCOUNTER — Encounter (HOSPITAL_COMMUNITY): Payer: Medicare Other

## 2022-06-08 ENCOUNTER — Ambulatory Visit (HOSPITAL_COMMUNITY): Payer: Medicare Other | Attending: Cardiology

## 2022-06-08 ENCOUNTER — Ambulatory Visit (HOSPITAL_BASED_OUTPATIENT_CLINIC_OR_DEPARTMENT_OTHER): Payer: Medicare Other

## 2022-06-08 VITALS — Ht 66.0 in | Wt 165.0 lb

## 2022-06-08 DIAGNOSIS — E785 Hyperlipidemia, unspecified: Secondary | ICD-10-CM

## 2022-06-08 DIAGNOSIS — I10 Essential (primary) hypertension: Secondary | ICD-10-CM | POA: Diagnosis not present

## 2022-06-08 DIAGNOSIS — I350 Nonrheumatic aortic (valve) stenosis: Secondary | ICD-10-CM | POA: Insufficient documentation

## 2022-06-08 DIAGNOSIS — E118 Type 2 diabetes mellitus with unspecified complications: Secondary | ICD-10-CM

## 2022-06-08 DIAGNOSIS — Z794 Long term (current) use of insulin: Secondary | ICD-10-CM | POA: Insufficient documentation

## 2022-06-08 DIAGNOSIS — I25118 Atherosclerotic heart disease of native coronary artery with other forms of angina pectoris: Secondary | ICD-10-CM

## 2022-06-08 LAB — ECHOCARDIOGRAM COMPLETE
AR max vel: 1.88 cm2
AV Area VTI: 2.06 cm2
AV Area mean vel: 2.04 cm2
AV Mean grad: 7 mmHg
AV Peak grad: 13.5 mmHg
Ao pk vel: 1.84 m/s
Area-P 1/2: 3.12 cm2
Height: 66 in
S' Lateral: 2.4 cm
Weight: 2640 oz

## 2022-06-08 LAB — MYOCARDIAL PERFUSION IMAGING
LV dias vol: 68 mL (ref 62–150)
LV sys vol: 24 mL
Nuc Stress EF: 64 %
Peak HR: 81 {beats}/min
Rest HR: 69 {beats}/min
Rest Nuclear Isotope Dose: 10.4 mCi
SDS: 4
SRS: 2
SSS: 6
ST Depression (mm): 0 mm
Stress Nuclear Isotope Dose: 32.6 mCi
TID: 1.2

## 2022-06-08 MED ORDER — TECHNETIUM TC 99M TETROFOSMIN IV KIT
10.4000 | PACK | Freq: Once | INTRAVENOUS | Status: AC | PRN
  Administered 2022-06-08: 10.4 via INTRAVENOUS

## 2022-06-08 MED ORDER — REGADENOSON 0.4 MG/5ML IV SOLN
0.4000 mg | Freq: Once | INTRAVENOUS | Status: AC
  Administered 2022-06-08: 0.4 mg via INTRAVENOUS

## 2022-06-08 MED ORDER — TECHNETIUM TC 99M TETROFOSMIN IV KIT
32.6000 | PACK | Freq: Once | INTRAVENOUS | Status: AC | PRN
  Administered 2022-06-08: 32.6 via INTRAVENOUS

## 2022-06-08 MED ORDER — PERFLUTREN LIPID MICROSPHERE
1.0000 mL | INTRAVENOUS | Status: AC | PRN
  Administered 2022-06-08: 2 mL via INTRAVENOUS

## 2022-06-09 ENCOUNTER — Telehealth: Payer: Self-pay | Admitting: *Deleted

## 2022-06-09 NOTE — Telephone Encounter (Signed)
Spoke with pt, he is aware of results. He reports the symptoms that prompted his appointment has gotten better. He does continue to get a pinching in his chest but not as often and not everyday. His legs get tired faster when he is walking but no pain. His SOB comes and goes and can occur with different circumstances. He is wanting to wait right now before scheduling anything. He has a follow up appointment with NP 06/25/22 and will call prior to that appointment if symptoms change.

## 2022-06-09 NOTE — Telephone Encounter (Signed)
-----   Message from Lelon Perla, MD sent at 06/08/2022  5:19 PM EST ----- Ischemia is mild.  Would based on symptoms.  If his symptoms are concerning could arrange cardiac catheterization. Kirk Ruths

## 2022-06-11 ENCOUNTER — Telehealth: Payer: Self-pay

## 2022-06-11 ENCOUNTER — Encounter (HOSPITAL_COMMUNITY): Payer: Medicare Other

## 2022-06-11 ENCOUNTER — Other Ambulatory Visit (HOSPITAL_COMMUNITY): Payer: Medicare Other

## 2022-06-11 NOTE — Telephone Encounter (Signed)
Spoke with pt. Pt was notified of echo results and recommendations. Pt will follow up as planned.

## 2022-06-11 NOTE — Telephone Encounter (Signed)
Patient was returning call. Please advise ?

## 2022-06-11 NOTE — Telephone Encounter (Signed)
Lmom to discuss echo results. Waiting on a return call. 

## 2022-06-24 ENCOUNTER — Encounter (HOSPITAL_COMMUNITY): Payer: Medicare Other

## 2022-06-24 ENCOUNTER — Other Ambulatory Visit (HOSPITAL_COMMUNITY): Payer: Medicare Other

## 2022-06-25 ENCOUNTER — Encounter: Payer: Self-pay | Admitting: Nurse Practitioner

## 2022-06-25 ENCOUNTER — Ambulatory Visit: Payer: Medicare Other | Attending: Nurse Practitioner | Admitting: Nurse Practitioner

## 2022-06-25 VITALS — BP 136/62 | HR 61 | Ht 65.0 in | Wt 163.0 lb

## 2022-06-25 DIAGNOSIS — I350 Nonrheumatic aortic (valve) stenosis: Secondary | ICD-10-CM | POA: Diagnosis not present

## 2022-06-25 DIAGNOSIS — I25118 Atherosclerotic heart disease of native coronary artery with other forms of angina pectoris: Secondary | ICD-10-CM

## 2022-06-25 DIAGNOSIS — I10 Essential (primary) hypertension: Secondary | ICD-10-CM

## 2022-06-25 DIAGNOSIS — E785 Hyperlipidemia, unspecified: Secondary | ICD-10-CM | POA: Diagnosis not present

## 2022-06-25 DIAGNOSIS — E118 Type 2 diabetes mellitus with unspecified complications: Secondary | ICD-10-CM

## 2022-06-25 DIAGNOSIS — Z794 Long term (current) use of insulin: Secondary | ICD-10-CM

## 2022-06-25 NOTE — Progress Notes (Signed)
Office Visit    Patient Name: Tommy Lopez Date of Encounter: 06/25/2022  Primary Care Provider:  Reynold Bowen, MD Primary Cardiologist:  Kirk Ruths, MD  Chief Complaint    71 year old male with a history of CAD, hypertension, hyperlipidemia, mild aortic stenosis, legally blind   and type 2 diabetes who presents for follow-up related to CAD.   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 EF 70-75%   CARDIAC CATHETERIZATION  08/29/2019   CORONARY STENT INTERVENTION N/A 08/29/2019   Procedure: CORONARY STENT INTERVENTION;  Surgeon: Jettie Booze, MD;  Location: [facility];  Service: Cardiovascular;  Laterality: N/A;   CORONARY STENT PLACEMENT  08/29/2019   EYE SURGERY     INTRAVASCULAR ULTRASOUND/IVUS N/A 08/29/2019   Procedure: Intravascular Ultrasound/IVUS;  Surgeon: Jettie Booze, MD;  Location: [facility];  Service: Cardiovascular;  Laterality: N/A;   LEFT HEART CATH AND CORONARY ANGIOGRAPHY N/A 08/29/2019   Procedure: LEFT HEART CATH AND CORONARY ANGIOGRAPHY;  Surgeon: Jettie Booze, MD;  Location: [facility];  Service: Cardiovascular;  Laterality: N/A;    Allergies  No Known Allergies  History of Present Illness    71 year old male with the above past medical history including CAD, hypertension, hyperlipidemia, mild aortic stenosis, legally blind, and type 2 diabetes.    Cardiac catheterization in 2004 showed occluded OM1, this was treated medically.  Stress test in January 2021 showed EF 64%, small area of apical  lateral ischemia.  Repeat cardiac Catheterization in February 2021 showed left circumflex 75%, 60% second diagonal, occluded second marginal, normal LV function.  He had PCI of circumflex with DES.  He did not tolerate higher doses of Crestor or Lipitor and has been maintained on pravastatin.  Echocardiogram in July 2022 showed normal LV function, G1 DD, very mild aortic stenosis, mean gradient 9 mmHg.  He contacted our office on 05/11/2022 and noted a 2-3-week history of a "pinching feeling" in the left side of his chest, increased fatigue and shortness of breath, similar to prior anginal equivalent.  He was last seen in the office on 05/13/2022 and noted intermittent fleeting chest discomfort, decreased activity tolerance.  Echocardiogram showed EF 65 to 70%, normal LV function, G1 DD, normal RV systolic function, no significant valvular abnormalities.  Lexiscan Myoview consistent with mild ischemia.  It was noted that should he have increasingly concerning symptoms, could consider cardiac catheterization.   He presents today for follow-up accompanied by his seeing-eye dog, Mel Almond. Since his last visit he has been stable overall from a cardiac standpoint.  He continues to note rare intermittent chest discomfort which he describes as a thumping or pinching sensation.  This occurs only with exertion. He states it is noticeable but not painful. He is not interested in pursuing further ischemic evaluation at this point in time.  Additionally, he notes that he had a recent fall in the setting of low blood sugar. He hit his head and suffered a mild concussion that also triggered episodes of vertigo.  He has seen his neurologist.  He is slowly improving.  Otherwise, he reports feeling well.  Home Medications    Current Outpatient Medications  Medication Sig Dispense Refill   amLODipine (NORVASC) 2.5 MG tablet Take 1 tablet (2.5 mg total) by mouth daily. 90 tablet 3   Ascorbic Acid (VITAMIN C) 1000 MG tablet Take  1,000 mg by mouth daily.     aspirin EC 81 MG tablet Take 81 mg by mouth daily.     diazepam (VALIUM) 2 MG tablet Take 2 mg by mouth daily as needed (dizziness).      doxepin (SINEQUAN) 10 MG capsule Take by mouth.     ezetimibe (ZETIA) 10 MG tablet Take 1 tablet (10 mg total) by mouth daily. 90 tablet 3   finasteride (PROSCAR) 5 MG tablet Take 5 mg by mouth daily.     fish oil-omega-3 fatty acids 1000 MG capsule Take 1,200 mg by mouth 4 (four) times daily.     fluconazole (DIFLUCAN) 100 MG tablet Take by mouth.     imiquimod (ALDARA) 5 % cream Apply 1 application topically daily as needed (Joint pain).      insulin lispro (HUMALOG) 100 UNIT/ML injection Inject 8-14 Units into the skin See admin instructions. Take 8-14 units in the morning based on blood glucose Take 12-18 in the evening Based on blood glucose Mix with Humulin N     insulin NPH Human (NOVOLIN N) 100 UNIT/ML injection Inject 12-18 Units into the skin See admin instructions. Take 12-18 units in the morning based on blood glucose Take 16-20 units in the evening based on blood glucose Mix with Humalog     loratadine (CLARITIN) 10 MG tablet Take 10 mg by mouth daily.     methocarbamol (ROBAXIN) 500 MG tablet Take 500 mg by mouth 3 (three) times daily.     metoprolol succinate (TOPROL-XL) 25 MG 24 hr tablet Take 1 tablet (25 mg total) by mouth daily. 90 tablet 3   Multiple Vitamins-Minerals (CENTRUM SILVER) tablet Take 1 tablet by mouth daily.     mupirocin ointment (BACTROBAN) 2 % Apply 1 application topically daily as needed (Skin irritation).      nitroGLYCERIN (NITROSTAT) 0.4 MG SL tablet Place 1 tablet (0.4 mg total) under the tongue every 5 (five) minutes as needed for chest pain (chest pain). 25 tablet 4   OVER THE COUNTER MEDICATION Take 1 capsule by mouth 2 (two) times daily. oil of oregano     pantoprazole (PROTONIX) 40 MG tablet Take 40 mg by mouth daily.     polyethylene glycol powder (GLYCOLAX/MIRALAX) 17 GM/SCOOP  powder Take by mouth.     pravastatin (PRAVACHOL) 40 MG tablet Take 1 tablet (40 mg total) by mouth every evening. 90 tablet 3   sucralfate (CARAFATE) 1 g tablet Take by mouth.     tamsulosin (FLOMAX) 0.4 MG CAPS capsule Take 0.4 mg by mouth at bedtime.      Vitamin D, Ergocalciferol, (DRISDOL) 1.25 MG (50000 UNIT) CAPS capsule Take 50,000 Units by mouth once a week.     zolpidem (AMBIEN) 10 MG tablet Take 10 mg by mouth at bedtime.     No current facility-administered medications for this visit.     Review of Systems    He denies palpitations, dyspnea, pnd, orthopnea, n, v, dizziness, syncope, edema, weight gain, or early satiety. All other systems reviewed and are otherwise negative except as noted above.   Physical Exam    VS:  BP 136/62   Pulse 61  Ht '5\' 5"'$  (1.651 m)   Wt 163 lb (73.9 kg)   SpO2 98%   BMI 27.12 kg/m  GEN: Well nourished, well developed, in no acute distress. HEENT: normal. Neck: Supple, no JVD, carotid bruits, or masses. Cardiac: RRR, no murmurs, rubs, or gallops. No clubbing, cyanosis, edema.  Radials/DP/PT 2+ and equal bilaterally.  Respiratory:  Respirations regular and unlabored, clear to auscultation bilaterally. GI: Soft, nontender, nondistended, BS + x 4. MS: no deformity or atrophy. Skin: warm and dry, no rash. Neuro:  Strength and sensation are intact. Psych: Normal affect.  Accessory Clinical Findings    ECG personally reviewed by me today -no EKG in office today.   Lab Results  Component Value Date   WBC 7.4 08/30/2019   HGB 13.2 08/30/2019   HCT 40.0 08/30/2019   MCV 90.3 08/30/2019   PLT 164 08/30/2019   Lab Results  Component Value Date   CREATININE 1.10 09/04/2020   BUN 17 09/04/2020   NA 140 09/04/2020   K 4.6 09/04/2020   CL 105 09/04/2020   CO2 31 09/04/2020   Lab Results  Component Value Date   ALT 19 03/12/2021   AST 21 03/12/2021   ALKPHOS 67 07/15/2018   BILITOT 0.7 03/12/2021   Lab Results  Component Value  Date   CHOL 136 03/12/2021   HDL 40 03/12/2021   LDLCALC 83 03/12/2021   TRIG 57 03/12/2021   CHOLHDL 3.4 03/12/2021    Lab Results  Component Value Date   HGBA1C 7.7 (H) 08/29/2019    Assessment & Plan    1. CAD/chest pain/dyspnea on exertion: Cath in February 2021 following abnormal myoview showed left circumflex 75%, 60% second diagonal, occluded second marginal, normal LV function.  He had PCI of circumflex with DES.  The past month he has noted intermittent chest discomfort which she describes as a pinching feeling on the left side of his chest that occurs with exertion.  Echo in 05/2022 showed EF 65 to 70%, normal LV function, G1 DD, normal RV systolic function, no significant valvular abnormalities.  Recent [employer] consistent with mild ischemia.  It was noted that should he have increasingly concerning symptoms, could consider cardiac catheterization.  Discussed recent echocardiogram and stress test results with patient.  Though he continues to note mild symptoms, he would like to defer further ischemic evaluation/cardiac catheterization at this time.  Discussed possible addition of Imdur vs increasing amlodipine, however, patient declines.  Discussed ED precautions.  Continue to monitor symptoms.  He will keep his follow-up with Dr. Stanford Breed in February 2024.  Continue aspirin, metoprolol, amlodipine, pravastatin, and Zetia.  2. Hypertension: BP well controlled. Continue current antihypertensive regimen.    3. Hyperlipidemia: LDL was 58 in 03/2022. Continue aspirin, pravastatin, Zetia.   4. Aortic stenosis: Echocardiogram in July 2022 showed normal LV function, G1 DD, very mild aortic stenosis, mean gradient 9 mmHg.  No notable aortic stenosis on most recent echo in 05/2022.  Consider repeat echo as clinically indicated.   5. Type 2 diabetes: A1c was 8.1 in 03/2022. Monitored and managed per PCP.    6. Disposition: Follow-up as scheduled with Dr. Stanford Breed in 08/2022.      Lenna Sciara, NP 06/25/2022, 12:28 PM

## 2022-06-25 NOTE — Patient Instructions (Signed)
Medication Instructions:  Your physician recommends that you continue on your current medications as directed. Please refer to the Current Medication list given to you today.  *If you need a refill on your cardiac medications before your next appointment, please call your pharmacy*   Lab Work: NONE ordered at this time of appointment   If you have labs (blood work) drawn today and your tests are completely normal, you will receive your results only by: [place] (if you have MyChart) OR A paper copy in the mail If you have any lab test that is abnormal or we need to change your treatment, we will call you to review the results.   Testing/Procedures: NONE ordered at this time of appointment     Follow-Up: At [facility], you and your health needs are our priority.  As part of our continuing mission to provide you with exceptional heart care, we have created designated Provider Care Teams.  These Care Teams include your primary Cardiologist (physician) and Advanced Practice Providers (APPs -  Physician Assistants and Nurse Practitioners) who all work together to provide you with the care you need, when you need it.  We recommend signing up for the patient portal called "MyChart".  Sign up information is provided on this After Visit Summary.  MyChart is used to connect with patients for Virtual Visits (Telemedicine).  Patients are able to view lab/test results, encounter notes, upcoming appointments, etc.  Non-urgent messages can be sent to your provider as well.   To learn more about what you can do with MyChart, go to [email or web address].    Your next appointment:    Keep follow up   The format for your next appointment:   In Person  Provider:   Kirk Ruths, MD     Other Instructions   Important Information About Sugar

## 2022-08-10 NOTE — Progress Notes (Signed)
HPI: FU coronary artery disease; cath in 2004 showing occlusion of OM 1 treated medically. Carotid Dopplers October 2019 showed no hemodynamically significant stenosis. Cardiac catheterization February 2021 showed 75% circumflex, 60% second diagonal, occluded second marginal, normal LV function. Patient had PCI of circumflex with drug-eluting stent.  Lipitor changed to Crestor at previous office visit due to myalgias. Echocardiogram December 2023 showed normal LV function, grade 1 diastolic dysfunction, trace aortic insufficiency.  Nuclear study December 2023 showed ejection fraction 64% and possible mild ischemia in the anterolateral/inferolateral segments.  Since last seen, he continues to have occasional chest pain.  It is substernal and he feels similar to his previous cardiac pain.  It can happen occasionally at rest for several minutes but also intermittently with exertion.  However there are times when he can walk 2-1/2 miles with having no symptoms.  He has occasional dyspnea as well but no syncope.  Current Outpatient Medications  Medication Sig Dispense Refill   amLODipine (NORVASC) 2.5 MG tablet Take 1 tablet (2.5 mg total) by mouth daily. 90 tablet 3   Ascorbic Acid (VITAMIN C) 1000 MG tablet Take 1,000 mg by mouth daily.     aspirin EC 81 MG tablet Take 81 mg by mouth daily.     diazepam (VALIUM) 2 MG tablet Take 2 mg by mouth daily as needed (dizziness).      doxepin (SINEQUAN) 10 MG capsule Take 10 mg by mouth daily.     ezetimibe (ZETIA) 10 MG tablet Take 1 tablet (10 mg total) by mouth daily. 90 tablet 3   finasteride (PROSCAR) 5 MG tablet Take 5 mg by mouth daily.     imiquimod (ALDARA) 5 % cream Apply 1 application topically daily as needed (Joint pain).      insulin lispro (HUMALOG) 100 UNIT/ML injection Inject 8-14 Units into the skin See admin instructions. Take 8-14 units in the morning based on blood glucose Take 12-18 in the evening Based on blood glucose Mix with  Humulin N     insulin NPH Human (NOVOLIN N) 100 UNIT/ML injection Inject 12-18 Units into the skin See admin instructions. Take 12-18 units in the morning based on blood glucose Take 16-20 units in the evening based on blood glucose Mix with Humalog     loratadine (CLARITIN) 10 MG tablet Take 10 mg by mouth daily.     methocarbamol (ROBAXIN) 500 MG tablet Take 500 mg by mouth 3 (three) times daily.     metoprolol succinate (TOPROL-XL) 25 MG 24 hr tablet Take 1 tablet (25 mg total) by mouth daily. 90 tablet 3   Multiple Vitamins-Minerals (CENTRUM SILVER) tablet Take 1 tablet by mouth daily.     mupirocin ointment (BACTROBAN) 2 % Apply 1 application topically daily as needed (Skin irritation).      nitroGLYCERIN (NITROSTAT) 0.4 MG SL tablet Place 1 tablet (0.4 mg total) under the tongue every 5 (five) minutes as needed for chest pain (chest pain). 25 tablet 4   pantoprazole (PROTONIX) 40 MG tablet Take 40 mg by mouth daily.     polyethylene glycol powder (GLYCOLAX/MIRALAX) 17 GM/SCOOP powder Take by mouth.     pravastatin (PRAVACHOL) 40 MG tablet Take 1 tablet (40 mg total) by mouth every evening. 90 tablet 3   sucralfate (CARAFATE) 1 g tablet Take 1 g by mouth as needed (Ulcers).     tamsulosin (FLOMAX) 0.4 MG CAPS capsule Take 0.4 mg by mouth at bedtime.      Vitamin D,  Ergocalciferol, (DRISDOL) 1.25 MG (50000 UNIT) CAPS capsule Take 50,000 Units by mouth once a week.     zolpidem (AMBIEN) 10 MG tablet Take 10 mg by mouth at bedtime.     No current facility-administered medications for this visit.     Past Medical History:  Diagnosis Date   Atherosclerotic coronary vascular disease    SINGLE VESSEL OCCULSIVE   Diabetes mellitus    Hyperlipidemia    Hypertension    IHD (ischemic heart disease)    SOB (shortness of breath) on exertion     Past Surgical History:  Procedure Laterality Date   CARDIAC CATHETERIZATION  08/03/2002   NORMAL LEFT VENTRICULAR SIZE AND EXCELLENT CONTRACTILITY  WITH NO SEGMENTAL WALL ABNORMALITIES. EF 70-75%   CARDIAC CATHETERIZATION  08/29/2019   CORONARY STENT INTERVENTION N/A 08/29/2019   Procedure: CORONARY STENT INTERVENTION;  Surgeon: Jettie Booze, MD;  Location: [place] [facility];  Service: Cardiovascular;  Laterality: N/A;   CORONARY STENT PLACEMENT  08/29/2019   EYE SURGERY     INTRAVASCULAR ULTRASOUND/IVUS N/A 08/29/2019   Procedure: Intravascular Ultrasound/IVUS;  Surgeon: Jettie Booze, MD;  Location: [place] [facility];  Service: Cardiovascular;  Laterality: N/A;   LEFT HEART CATH AND CORONARY ANGIOGRAPHY N/A 08/29/2019   Procedure: LEFT HEART CATH AND CORONARY ANGIOGRAPHY;  Surgeon: Jettie Booze, MD;  Location: [place] [facility];  Service: Cardiovascular;  Laterality: N/A;    Social History   Socioeconomic History   Marital status: Married    Spouse name: Not on file   Number of children: Not on file   Years of education: Not on file   Highest education level: Not on file  Occupational History   Not on file  Tobacco Use   Smoking status: Never   Smokeless tobacco: Never  Vaping Use   Vaping Use: Never used  Substance and Sexual Activity   Alcohol use: No   Drug use: No   Sexual activity: Not on file  Other Topics Concern   Not on file  Social History Narrative   Not on file   Social Determinants of Health   Financial Resource Strain: Not on file  Food Insecurity: Not on file  Transportation Needs: Not on file  Physical Activity: Not on file  Stress: Not on file  Social Connections: Not on file  Intimate Partner Violence: Not on file    Family History  Problem Relation Age of Onset   Arthritis Mother    Heart disease Father     ROS: no fevers or chills, productive cough, hemoptysis, dysphasia, odynophagia, melena, hematochezia, dysuria, hematuria, rash, seizure activity, orthopnea, PND, pedal edema, claudication. Remaining systems are negative.  Physical Exam: Well-developed  well-nourished in no acute distress.  Skin is warm and dry.  HEENT is normal.  Neck is supple.  Chest is clear to auscultation with normal expansion.  Cardiovascular exam is regular rate and rhythm.  Abdominal exam nontender or distended. No masses palpated. Extremities show no edema. neuro grossly intact  ECG- personally reviewed  A/P  1 coronary artery disease-patient continues to have occasional chest pain.  I have personally reviewed his previous nuclear study and it is not high risk.  We discussed options today including cardiac catheterization.  He would like to defer for now.  He will contact us if he would be agreeable to proceed with catheterization.  Otherwise I will see him back in 3 months to make sure that his symptoms are stable.  Will continue  aspirin, pravastatin, amlodipine and Toprol.  2 history of mild aortic stenosis-calcified aortic valve on recent echocardiogram but no significant aortic stenosis.  3 hypertension-patient's blood pressure is controlled today.  Continue present medical regimen.  4 hyperlipidemia-continue pravastatin and Zetia.  Kirk Ruths, MD

## 2022-08-24 ENCOUNTER — Ambulatory Visit: Payer: Medicare Other | Admitting: Cardiology

## 2022-08-24 ENCOUNTER — Encounter: Payer: Self-pay | Admitting: Cardiology

## 2022-08-24 VITALS — BP 124/53 | HR 66 | Ht 66.0 in | Wt 167.8 lb

## 2022-08-24 DIAGNOSIS — I10 Essential (primary) hypertension: Secondary | ICD-10-CM

## 2022-08-24 DIAGNOSIS — R072 Precordial pain: Secondary | ICD-10-CM | POA: Diagnosis not present

## 2022-08-24 DIAGNOSIS — E785 Hyperlipidemia, unspecified: Secondary | ICD-10-CM

## 2022-08-24 DIAGNOSIS — I2510 Atherosclerotic heart disease of native coronary artery without angina pectoris: Secondary | ICD-10-CM | POA: Diagnosis not present

## 2022-08-24 DIAGNOSIS — I2583 Coronary atherosclerosis due to lipid rich plaque: Secondary | ICD-10-CM

## 2022-08-24 NOTE — Patient Instructions (Signed)
    Follow-Up: At [facility], you and your health needs are our priority.  As part of our continuing mission to provide you with exceptional heart care, we have created designated Provider Care Teams.  These Care Teams include your primary Cardiologist (physician) and Advanced Practice Providers (APPs -  Physician Assistants and Nurse Practitioners) who all work together to provide you with the care you need, when you need it.  We recommend signing up for the patient portal called "MyChart".  Sign up information is provided on this After Visit Summary.  MyChart is used to connect with patients for Virtual Visits (Telemedicine).  Patients are able to view lab/test results, encounter notes, upcoming appointments, etc.  Non-urgent messages can be sent to your provider as well.   To learn more about what you can do with MyChart, go to [email or web address].    Your next appointment:   3 month(s)  Provider:   Kirk Ruths, MD

## 2022-09-29 ENCOUNTER — Ambulatory Visit
Admission: EM | Admit: 2022-09-29 | Discharge: 2022-09-29 | Disposition: A | Discharge status: Home / Self Care | Payer: Medicare Other | Attending: Family Medicine | Admitting: Family Medicine

## 2022-09-29 DIAGNOSIS — J209 Acute bronchitis, unspecified: Secondary | ICD-10-CM | POA: Diagnosis not present

## 2022-09-29 DIAGNOSIS — R062 Wheezing: Secondary | ICD-10-CM

## 2022-09-29 MED ORDER — BENZONATATE 200 MG PO CAPS: 200.0000 mg | ORAL_CAPSULE | Freq: Two times a day (BID) | ORAL | 0 refills | Status: DC | PRN

## 2022-09-29 MED ORDER — PREDNISONE 20 MG PO TABS: 40.0000 mg | ORAL_TABLET | Freq: Every day | ORAL | 0 refills | Status: DC

## 2022-09-29 MED ORDER — DOXYCYCLINE HYCLATE 100 MG PO CAPS: 100.0000 mg | ORAL_CAPSULE | Freq: Two times a day (BID) | ORAL | 0 refills | Status: DC

## 2022-09-29 NOTE — ED Provider Notes (Signed)
[facility]    CSN: [id number] Arrival date & time: 09/29/22  0851      History   Chief Complaint Chief Complaint  Patient presents with   Cough    HPI Tommy Lopez is a 72 y.o. male.   HPI  Pleasant 72 year old gentleman.  He and his wife are here together with similar symptoms.  They are both blind.  Patient states states that for 10 weeks.  Congestion.  Chest hurts from the coughing.  Coughing up sputum.  Feels very tired.  Worried about pneumonia.  No underlying lung disease.  No runny nose or sore throat.  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   Abnormal nuclear stress test 08/30/2019   Status post coronary artery stent placement    CAD (coronary artery disease), native coronary artery 08/29/2019   Lumbar pain 09/24/2016   Nerve disorder 09/24/2016   Occlusion and stenosis of bilateral carotid arteries 12/16/2015   CAD (coronary artery disease) 11/27/2015   Essential hypertension 11/27/2015   Bruit 11/27/2015   Vertigo 04/03/2013   Chest pain at rest 02/22/2013   Ischemic heart disease 12/03/2010   Diabetes mellitus ([place]) 12/03/2010   Hypercholesterolemia 12/03/2010   Blind in both eyes 12/03/2010   Benign prostatic hyperplasia with lower urinary tract symptoms 11/25/2010   Acquired spondylolisthesis 07/11/2009    Past Surgical History:  Procedure Laterality Date   CARDIAC CATHETERIZATION  08/03/2002   NORMAL LEFT VENTRICULAR SIZE AND EXCELLENT CONTRACTILITY WITH NO SEGMENTAL WALL ABNORMALITIES. EF 70-75%   CARDIAC CATHETERIZATION  08/29/2019   CORONARY STENT INTERVENTION N/A 08/29/2019   Procedure: CORONARY  STENT INTERVENTION;  Surgeon: Jettie Booze, MD;  Location: [place] [facility];  Service: Cardiovascular;  Laterality: N/A;   CORONARY STENT PLACEMENT  08/29/2019   EYE SURGERY     INTRAVASCULAR ULTRASOUND/IVUS N/A 08/29/2019   Procedure: Intravascular Ultrasound/IVUS;  Surgeon: Jettie Booze, MD;  Location: [place] [facility];  Service: Cardiovascular;  Laterality: N/A;   LEFT HEART CATH AND CORONARY ANGIOGRAPHY N/A 08/29/2019   Procedure: LEFT HEART CATH AND CORONARY ANGIOGRAPHY;  Surgeon: Jettie Booze, MD;  Location: [place] [facility];  Service: Cardiovascular;  Laterality: N/A;       Home Medications    Prior to Admission medications   Medication Sig Start Date End Date Taking? Authorizing Provider  benzonatate (TESSALON) 200 MG capsule Take 1 capsule (200 mg total) by mouth 2 (two) times daily as needed for cough. 09/29/22  Yes Raylene Everts, MD  doxycycline (VIBRAMYCIN) 100 MG capsule Take 1 capsule (100 mg total) by mouth 2 (two) times daily. 09/29/22  Yes Raylene Everts, MD  predniSONE (DELTASONE) 20 MG tablet Take 2 tablets (40 mg total) by mouth daily with breakfast. 09/29/22  Yes Raylene Everts, MD  amLODipine (NORVASC) 2.5 MG tablet Take 1 tablet (2.5 mg total) by mouth daily. 05/07/22   Lelon Perla, MD  Ascorbic Acid (VITAMIN C) 1000 MG tablet Take 1,000 mg by mouth daily.    [provider]  aspirin EC 81 MG tablet Take 81 mg by  mouth daily.    [provider]  diazepam (VALIUM) 2 MG tablet Take 2 mg by mouth daily as needed (dizziness).  10/22/15   [provider]  doxepin (SINEQUAN) 10 MG capsule Take 10 mg by mouth daily. 12/08/21   [provider]  ezetimibe (ZETIA) 10 MG tablet Take 1 tablet (10 mg total) by mouth daily. 03/04/22   Lelon Perla, MD  finasteride (PROSCAR) 5 MG tablet Take 5 mg by mouth daily. 01/28/22   [provider]  imiquimod (ALDARA) 5 % cream Apply 1 application  topically daily as needed (Joint pain).  07/13/19   [provider]  insulin lispro (HUMALOG) 100 UNIT/ML injection Inject 8-14 Units into the skin See admin instructions. Take 8-14 units in the morning based on blood glucose Take 12-18 in the evening Based on blood glucose Mix with Humulin N    [provider]  insulin NPH Human (NOVOLIN N) 100 UNIT/ML injection Inject 12-18 Units into the skin See admin instructions. Take 12-18 units in the morning based on blood glucose Take 16-20 units in the evening based on blood glucose Mix with Humalog    [provider]  loratadine (CLARITIN) 10 MG tablet Take 10 mg by mouth daily.    [provider]  methocarbamol (ROBAXIN) 500 MG tablet Take 500 mg by mouth 3 (three) times daily. 02/02/22   [provider]  metoprolol succinate (TOPROL-XL) 25 MG 24 hr tablet Take 1 tablet (25 mg total) by mouth daily. 02/13/22   Lelon Perla, MD  Multiple Vitamins-Minerals (CENTRUM SILVER) tablet Take 1 tablet by mouth daily.    [provider]  mupirocin ointment (BACTROBAN) 2 % Apply 1 application topically daily as needed (Skin irritation).  06/05/14   [provider]  nitroGLYCERIN (NITROSTAT) 0.4 MG SL tablet Place 1 tablet (0.4 mg total) under the tongue every 5 (five) minutes as needed for chest pain (chest pain). 02/25/22   Lelon Perla, MD  pantoprazole (PROTONIX) 40 MG tablet Take 40 mg by mouth daily. 08/26/20   [provider]  polyethylene glycol powder (GLYCOLAX/MIRALAX) 17 GM/SCOOP powder Take by mouth. 02/21/20   [provider]  pravastatin (PRAVACHOL) 40 MG tablet Take 1 tablet (40 mg total) by mouth every evening. 11/17/21 11/12/22  Lelon Perla, MD  sucralfate (CARAFATE) 1 g tablet Take 1 g by mouth as needed (Ulcers). 02/21/20   [provider]  tamsulosin (FLOMAX) 0.4 MG CAPS capsule Take 0.4 mg by mouth at bedtime.  06/26/19   [provider]   Vitamin D, Ergocalciferol, (DRISDOL) 1.25 MG (50000 UNIT) CAPS capsule Take 50,000 Units by mouth once a week. 01/14/22   [provider]  zolpidem (AMBIEN) 10 MG tablet Take 10 mg by mouth at bedtime. 04/30/15   [provider]    Family History Family History  Problem Relation Age of Onset   Arthritis Mother    Heart disease Father     Social History Social History   Tobacco Use   Smoking status: Never   Smokeless tobacco: Never  Vaping Use   Vaping Use: Never used  Substance Use Topics   Alcohol use: No   Drug use: No     Allergies   Patient has no known allergies.   Review of Systems Review of Systems See HPI  Physical Exam Triage Vital Signs ED Triage Vitals  Enc Vitals Group     BP 09/29/22 1004 (!) 146/75  Pulse Rate 09/29/22 1004 64     Resp 09/29/22 1004 17     Temp 09/29/22 1004 97.6 F (36.4 C)     Temp Source 09/29/22 1004 Oral     SpO2 09/29/22 1004 99 %     Weight --      Height --      Head Circumference --      Peak Flow --      Pain Score 09/29/22 0959 0     Pain Loc --      Pain Edu? --      Excl. in [place]? --    No data found.  Updated Vital Signs BP (!) 146/75 (BP Location: Left Arm)   Pulse 64   Temp 97.6 F (36.4 C) (Oral)   Resp 17   SpO2 99%     Physical Exam Constitutional:      General: He is not in acute distress.    Appearance: He is well-developed. He is ill-appearing.  HENT:     Head: Normocephalic and atraumatic.     Right Ear: Tympanic membrane and ear canal normal.     Left Ear: Tympanic membrane and ear canal normal.     Mouth/Throat:     Mouth: Mucous membranes are moist.     Pharynx: No posterior oropharyngeal erythema.  Eyes:     Conjunctiva/sclera: Conjunctivae normal.     Pupils: Pupils are equal, round, and reactive to light.  Cardiovascular:     Rate and Rhythm: Normal rate and regular rhythm.     Heart sounds: Normal heart sounds.  Pulmonary:     Effort: Pulmonary effort is  normal. No respiratory distress.     Breath sounds: Wheezing and rhonchi present.  Abdominal:     General: There is no distension.     Palpations: Abdomen is soft.  Musculoskeletal:        General: Normal range of motion.     Cervical back: Normal range of motion.  Lymphadenopathy:     Cervical: No cervical adenopathy.  Skin:    General: Skin is warm and dry.  Neurological:     Mental Status: He is alert.  Psychiatric:        Mood and Affect: Mood normal.      UC Treatments / Results  Labs (all labs ordered are listed, but only abnormal results are displayed) Labs Reviewed - No data to display  EKG   Radiology No results found.  Procedures Procedures (including critical care time)  Medications Ordered in UC Medications - No data to display  Initial Impression / Assessment and Plan / UC Course  I have reviewed the triage vital signs and the nursing notes.  Pertinent labs & imaging results that were available during my care of the patient were reviewed by me and considered in my medical decision making (see chart for details).     The patient has a cough that been lasting for 2 weeks and states it is getting worse.  More harsh cough.  Coughing up sputum.  He is very tired from all the coughing.  Will treat with antibiotics, cough suppression with Tessalon.  Discussed increased sugar on prednisone.  Call for problems Final Clinical Impressions(s) / UC Diagnoses   Final diagnoses:  Acute bronchitis, unspecified organism  Wheezing     Discharge Instructions      Take the prednisone 40 mg once a day for 5 days ( 2 tabs) Take the antibiotic 2 x a day  for 7 days   TAKE WITH FOOD Take the tessalon perles for cough.  Take 2 x a day May take in addition mucinex dm Call for problems    ED Prescriptions     Medication Sig Dispense Auth. Provider   predniSONE (DELTASONE) 20 MG tablet Take 2 tablets (40 mg total) by mouth daily with breakfast. 10 tablet Raylene Everts, MD   doxycycline (VIBRAMYCIN) 100 MG capsule Take 1 capsule (100 mg total) by mouth 2 (two) times daily. 14 capsule Raylene Everts, MD   benzonatate (TESSALON) 200 MG capsule Take 1 capsule (200 mg total) by mouth 2 (two) times daily as needed for cough. 20 capsule Raylene Everts, MD      PDMP not reviewed this encounter.   Raylene Everts, MD 09/29/22 1351

## 2022-09-29 NOTE — ED Triage Notes (Addendum)
Pt c/o cough x 10 days. Taking mucinex DM and diabetic friendly cough syrup. COVID test neg at home. Hx of seasonal allergies. Takes claritin daily.

## 2022-09-29 NOTE — Discharge Instructions (Addendum)
Take the prednisone 40 mg once a day for 5 days ( 2 tabs) Take the antibiotic 2 x a day for 7 days   TAKE WITH FOOD Take the tessalon perles for cough.  Take 2 x a day May take in addition mucinex dm Call for problems

## 2022-11-02 NOTE — Progress Notes (Signed)
HPI: FU coronary artery disease; cath in 2004 showing occlusion of OM 1 treated medically. Carotid Dopplers October 2019 showed no hemodynamically significant stenosis. Cardiac catheterization February 2021 showed 75% circumflex, 60% second diagonal, occluded second marginal, normal LV function. Patient had PCI of circumflex with drug-eluting stent.  Lipitor changed to Crestor at previous office visit due to myalgias. Echocardiogram December 2023 showed normal LV function, grade 1 diastolic dysfunction, trace aortic insufficiency.  Nuclear study December 2023 showed ejection fraction 64% and possible mild ischemia in the anterolateral/inferolateral segments.  Since last seen, patient states that since early March he has had URI symptoms.  He was given doxycycline, prednisone and cough medications with some improvement.  However recently he has worsened again with nonproductive cough.  There is no fevers, chills, hemoptysis.  He otherwise denies dyspnea, exertional chest pain or syncope.  Current Outpatient Medications  Medication Sig Dispense Refill   amLODipine (NORVASC) 2.5 MG tablet Take 1 tablet (2.5 mg total) by mouth daily. 90 tablet 3   Ascorbic Acid (VITAMIN C) 1000 MG tablet Take 1,000 mg by mouth daily.     aspirin EC 81 MG tablet Take 81 mg by mouth daily.     azithromycin (ZITHROMAX Z-PAK) 250 MG tablet As directed 6 each 0   benzonatate (TESSALON) 200 MG capsule Take 1 capsule (200 mg total) by mouth 2 (two) times daily as needed for cough. 20 capsule 0   diazepam (VALIUM) 2 MG tablet Take 2 mg by mouth daily as needed (dizziness).      doxepin (SINEQUAN) 10 MG capsule Take 10 mg by mouth daily.     doxycycline (VIBRAMYCIN) 100 MG capsule Take 1 capsule (100 mg total) by mouth 2 (two) times daily. 14 capsule 0   ezetimibe (ZETIA) 10 MG tablet Take 1 tablet (10 mg total) by mouth daily. 90 tablet 3   finasteride (PROSCAR) 5 MG tablet Take 5 mg by mouth daily.     imiquimod (ALDARA)  5 % cream Apply 1 application topically daily as needed (Joint pain).      insulin lispro (HUMALOG) 100 UNIT/ML injection Inject 8-14 Units into the skin See admin instructions. Take 8-14 units in the morning based on blood glucose Take 12-18 in the evening Based on blood glucose Mix with Humulin N     insulin NPH Human (NOVOLIN N) 100 UNIT/ML injection Inject 12-18 Units into the skin See admin instructions. Take 12-18 units in the morning based on blood glucose Take 16-20 units in the evening based on blood glucose Mix with Humalog     loratadine (CLARITIN) 10 MG tablet Take 10 mg by mouth daily.     methocarbamol (ROBAXIN) 500 MG tablet Take 500 mg by mouth 3 (three) times daily.     metoprolol succinate (TOPROL-XL) 25 MG 24 hr tablet Take 1 tablet (25 mg total) by mouth daily. 90 tablet 3   mupirocin ointment (BACTROBAN) 2 % Apply 1 application topically daily as needed (Skin irritation).      nitroGLYCERIN (NITROSTAT) 0.4 MG SL tablet Place 1 tablet (0.4 mg total) under the tongue every 5 (five) minutes as needed for chest pain (chest pain). 25 tablet 4   pantoprazole (PROTONIX) 40 MG tablet Take 40 mg by mouth daily.     polyethylene glycol powder (GLYCOLAX/MIRALAX) 17 GM/SCOOP powder Take by mouth.     pravastatin (PRAVACHOL) 40 MG tablet Take 1 tablet (40 mg total) by mouth every evening. 90 tablet 3   predniSONE (  DELTASONE) 20 MG tablet Take 2 tablets (40 mg total) by mouth daily with breakfast. 10 tablet 0   sucralfate (CARAFATE) 1 g tablet Take 1 g by mouth as needed (Ulcers).     tamsulosin (FLOMAX) 0.4 MG CAPS capsule Take 0.4 mg by mouth at bedtime.      Vitamin D, Ergocalciferol, (DRISDOL) 1.25 MG (50000 UNIT) CAPS capsule Take 50,000 Units by mouth once a week.     zolpidem (AMBIEN) 10 MG tablet Take 10 mg by mouth at bedtime.     Multiple Vitamins-Minerals (CENTRUM SILVER) tablet Take 1 tablet by mouth daily. (Patient not taking: Reported on 11/09/2022)     No current  facility-administered medications for this visit.     Past Medical History:  Diagnosis Date   Atherosclerotic coronary vascular disease    SINGLE VESSEL OCCULSIVE   Diabetes mellitus    Hyperlipidemia    Hypertension    IHD (ischemic heart disease)    SOB (shortness of breath) on exertion     Past Surgical History:  Procedure Laterality Date   CARDIAC CATHETERIZATION  08/03/2002   NORMAL LEFT VENTRICULAR SIZE AND EXCELLENT CONTRACTILITY WITH NO SEGMENTAL WALL ABNORMALITIES. EF 70-75%   CARDIAC CATHETERIZATION  08/29/2019   CORONARY STENT INTERVENTION N/A 08/29/2019   Procedure: CORONARY STENT INTERVENTION;  Surgeon: Corky Crafts, MD;  Location: [facility] INVASIVE CV LAB;  Service: Cardiovascular;  Laterality: N/A;   CORONARY STENT PLACEMENT  08/29/2019   CORONARY ULTRASOUND/IVUS N/A 08/29/2019   Procedure: Intravascular Ultrasound/IVUS;  Surgeon: Corky Crafts, MD;  Location: [facility] INVASIVE CV LAB;  Service: Cardiovascular;  Laterality: N/A;   EYE SURGERY     LEFT HEART CATH AND CORONARY ANGIOGRAPHY N/A 08/29/2019   Procedure: LEFT HEART CATH AND CORONARY ANGIOGRAPHY;  Surgeon: Corky Crafts, MD;  Location: [facility] INVASIVE CV LAB;  Service: Cardiovascular;  Laterality: N/A;    Social History   Socioeconomic History   Marital status: Married    Spouse name: Not on file   Number of children: Not on file   Years of education: Not on file   Highest education level: Not on file  Occupational History   Not on file  Tobacco Use   Smoking status: Never   Smokeless tobacco: Never  Vaping Use   Vaping Use: Never used  Substance and Sexual Activity   Alcohol use: No   Drug use: No   Sexual activity: Not on file  Other Topics Concern   Not on file  Social History Narrative   Not on file   Social Determinants of Health   Financial Resource Strain: Not on file  Food Insecurity: Not on file  Transportation Needs: Not on file  Physical Activity: Not on file  Stress:  Not on file  Social Connections: Not on file  Intimate Partner Violence: Not on file    Family History  Problem Relation Age of Onset   Arthritis Mother    Heart disease Father     ROS: no fevers or chills, productive cough, hemoptysis, dysphasia, odynophagia, melena, hematochezia, dysuria, hematuria, rash, seizure activity, orthopnea, PND, pedal edema, claudication. Remaining systems are negative.  Physical Exam: Well-developed well-nourished in no acute distress.  Skin is warm and dry.  HEENT is normal.  Neck is supple.  Chest is clear to auscultation with normal expansion.  Cardiovascular exam is regular rate and rhythm.  Abdominal exam nontender or distended. No masses palpated. Extremities show no edema. neuro grossly intact   A/P  1  coronary artery disease-continue aspirin, statin and beta-blocker.  2 history of mild aortic stenosis-calcified aortic valve on most recent echocardiogram but no significant stenosis.  Does not sound to have significant aortic stenosis on physical exam.  Will follow.  3 hyperlipidemia-continue pravastatin and Zetia.  I will have his most recent lipids and liver forwarded to [place] from primary care.  4 hypertension-blood pressure controlled.  Continue present medications and follow-up.  5 upper respiratory infection-patient initially improved with doxycycline, prednisone and cough medication.  However his cough is worsening again and he is having difficulty sleeping.  I have given him a prescription for a Z-Pak and he will follow-up with primary care for this issue.  Olga Millers, MD

## 2022-11-09 ENCOUNTER — Ambulatory Visit: Payer: Medicare Other | Admitting: Cardiology

## 2022-11-09 ENCOUNTER — Encounter: Payer: Self-pay | Admitting: Cardiology

## 2022-11-09 VITALS — BP 120/62 | HR 66 | Ht 65.5 in | Wt 160.0 lb

## 2022-11-09 DIAGNOSIS — I2510 Atherosclerotic heart disease of native coronary artery without angina pectoris: Secondary | ICD-10-CM | POA: Diagnosis not present

## 2022-11-09 DIAGNOSIS — E785 Hyperlipidemia, unspecified: Secondary | ICD-10-CM | POA: Diagnosis not present

## 2022-11-09 DIAGNOSIS — I10 Essential (primary) hypertension: Secondary | ICD-10-CM

## 2022-11-09 DIAGNOSIS — I350 Nonrheumatic aortic (valve) stenosis: Secondary | ICD-10-CM | POA: Diagnosis not present

## 2022-11-09 DIAGNOSIS — I2583 Coronary atherosclerosis due to lipid rich plaque: Secondary | ICD-10-CM

## 2022-11-09 MED ORDER — AZITHROMYCIN 250 MG PO TABS: ORAL_TABLET | ORAL | 0 refills | Status: DC

## 2022-11-09 NOTE — Patient Instructions (Signed)
    Follow-Up: At [facility], you and your health needs are our priority.  As part of our continuing mission to provide you with exceptional heart care, we have created designated Provider Care Teams.  These Care Teams include your primary Cardiologist (physician) and Advanced Practice Providers (APPs -  Physician Assistants and Nurse Practitioners) who all work together to provide you with the care you need, when you need it.  We recommend signing up for the patient portal called "MyChart".  Sign up information is provided on this After Visit Summary.  MyChart is used to connect with patients for Virtual Visits (Telemedicine).  Patients are able to view lab/test results, encounter notes, upcoming appointments, etc.  Non-urgent messages can be sent to your provider as well.   To learn more about what you can do with MyChart, go to [email or web address].    Your next appointment:   6 month(s)  Provider:   Brian Crenshaw, MD      

## 2022-11-15 ENCOUNTER — Other Ambulatory Visit: Payer: Self-pay | Admitting: Cardiology

## 2022-11-15 DIAGNOSIS — E7800 Pure hypercholesterolemia, unspecified: Secondary | ICD-10-CM

## 2022-12-28 ENCOUNTER — Telehealth: Payer: Self-pay | Admitting: Cardiology

## 2022-12-28 NOTE — Telephone Encounter (Signed)
Spoke to the pt, for the past month pt has  c/o shortness of breath , pain of the left side shoulder to the chest, pt stated he did take one nitroglycerin tablet since June. He does not take the medication while having CP due to having  hot flashes and dizziness . Pt does monitor bp at home this AM 122/50. Pt is scheduled on 6/25 with DOD, explained ED precautions, and pt voiced understanding. Will forward to MD and nurse for advise.

## 2022-12-28 NOTE — Telephone Encounter (Signed)
Lewayne Bunting, MD  Freddi Starr, RNJust now (3:43 PM)    Agree with APP Sudie Grumbling Olga Millers

## 2022-12-28 NOTE — Telephone Encounter (Signed)
Pt c/o of Chest Pain: STAT if CP now or developed within 24 hours  1. Are you having CP right now? no  2. Are you experiencing any other symptoms (ex. SOB, nausea, vomiting, sweating)? Chest discomfort, SOB, tightness in chest, feeling flush   3. How long have you been experiencing CP? For a while   4. Is your CP continuous or coming and going? Coming and going  5. Have you taken Nitroglycerin? One early June  ?

## 2022-12-29 ENCOUNTER — Ambulatory Visit: Payer: Medicare Other | Attending: Cardiology | Admitting: Cardiology

## 2022-12-29 ENCOUNTER — Encounter: Payer: Self-pay | Admitting: Cardiology

## 2022-12-29 ENCOUNTER — Other Ambulatory Visit: Payer: Self-pay | Admitting: Cardiology

## 2022-12-29 VITALS — BP 134/84 | HR 76 | Ht 66.0 in | Wt 164.8 lb

## 2022-12-29 DIAGNOSIS — R072 Precordial pain: Secondary | ICD-10-CM

## 2022-12-29 DIAGNOSIS — I25119 Atherosclerotic heart disease of native coronary artery with unspecified angina pectoris: Secondary | ICD-10-CM | POA: Diagnosis not present

## 2022-12-29 DIAGNOSIS — Z01812 Encounter for preprocedural laboratory examination: Secondary | ICD-10-CM

## 2022-12-29 DIAGNOSIS — I209 Angina pectoris, unspecified: Secondary | ICD-10-CM

## 2022-12-29 MED ORDER — SODIUM CHLORIDE 0.9% FLUSH
3.0000 mL | Freq: Two times a day (BID) | INTRAVENOUS | Status: DC
Start: 2022-12-29 — End: 2023-01-13

## 2022-12-29 NOTE — H&P (View-Only) (Signed)
Cardiology Office Note:  .   Date:  12/29/2022  ID:  Tommy Lopez, DOB 02/11/1951, MRN [id number] PCP: South, Stephen, MD  [facility] Providers Cardiologist:  Brian Crenshaw, MD    History of Present Illness: .   Tommy Lopez is a 71 y.o. male seen as a work in for evaluation of chest pain. Was seen by Dr Crenshaw in May. Noted symptoms of URI. In March was treated with steroids, cough medication and doxycycline. When seen by Dr Crenshaw was given Rx for Z pak and recommended follow up with PCP for persistent symptoms. He states he did have a cough for several weeks but then this resolved. He has a history of CAD with stent of LCx in 2021. OM occluded. Had Myoview in Dec showing  ischemia but was not having angina at the time so did not pursue this further.  Echo showed normal EF with AV sclerosis (no stenosis.)  Patient reports for several weeks he has experienced left precordial chest pain similar to when he had his last stent. States it is actually worse than before. It is worse with exertion such as walking or going up stairs. He is more fatigued. He notes some SOB with this. 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 Walks with a seeing eye dog but can't walk as far now. Did take Ntg once and this helped with his chest pain symptoms.  ROS: see above  Studies Reviewed: .       Myoview 06/08/22: Study Highlights      Findings are consistent with ischemia. The study is intermediate risk.   No ST deviation was noted. The ECG was not diagnostic due to pharmacologic protocol.   LV perfusion is abnormal. Defect 1: There is a medium defect with moderate reduction in uptake present in the apical to basal anterolateral and inferolateral location(s) that is partially reversible. There is normal wall motion in the defect area. Consistent with ischemia. Defect 2: There is a small defect with moderate reduction in uptake present in the mid inferolateral location(s). There is normal wall motion in the defect  area. Consistent with artifact caused by diaphragmatic attenuation.   Left ventricular function is normal. Nuclear stress EF: 64 %. The left ventricular ejection fraction is normal (55-65%). End diastolic cavity size is normal. End systolic cavity size is normal.   Prior study available for comparison.   Two defects: one is likely artifact, with defect in the mid inferolateral area, worse at rest. There is significant extracardiac activity in this area with normal wall motion. Second defect is along the border of the anterolateral/inferolateral segments. This is most consistent with mild ischemia. Normal wall motion in this area. Intermediate risk as baseline uptake is abnormal, but this may be due to artifact   ECHO 06/08/22: IMPRESSIONS     1. Left ventricular ejection fraction, by estimation, is 65 to 70%. The  left ventricle has normal function. The left ventricle has no regional  wall motion abnormalities. Left ventricular diastolic parameters are  consistent with Grade I diastolic  dysfunction (impaired relaxation).   2. Right ventricular systolic function is normal. The right ventricular  size is normal. Tricuspid regurgitation signal is inadequate for assessing  PA pressure.   3. The mitral valve is abnormal. Trivial mitral valve regurgitation. No  evidence of mitral stenosis.   4. The aortic valve is tricuspid. There is moderate leaflet calcification  most notably on the NCC of the aortic valve. There is moderate thickening    of the aortic valve. Aortic valve regurgitation is trivial. Aortic valve  sclerosis/calcification is  present, without any evidence of aortic stenosis. Aortic valve area, by  VTI measures 2.06 cm. Aortic valve mean gradient measures 7.0 mmHg.  Aortic valve Vmax measures 1.84 m/s.   5. The inferior vena cava is normal in size with greater than 50%  respiratory variability, suggesting right atrial pressure of 3 mmHg.   Comparison(s): Compared to prior TTE on  01/2021, the mean aortic valve  gradient is 7mmHg compared to 9mmHg previously. There is aortic  sclerosis/calcification without significant stenosis.   Risk Assessment/Calculations:          Physical Exam:   VS:  BP 134/84   Pulse 76   Ht 5' 6" (1.676 m)   Wt 164 lb 12.8 oz (74.8 kg)   SpO2 96%   BMI 26.60 kg/m    Wt Readings from Last 3 Encounters:  12/29/22 164 lb 12.8 oz (74.8 kg)  11/09/22 160 lb (72.6 kg)  08/24/22 167 lb 12.8 oz (76.1 kg)    GEN: Well nourished, well developed in no acute distress NECK: No JVD; No carotid bruits CARDIAC: RRR, no murmurs, rubs, gallops RESPIRATORY:  Clear to auscultation without rales, wheezing or rhonchi  ABDOMEN: Soft, non-tender, non-distended EXTREMITIES:  No edema; No deformity   ASSESSMENT AND PLAN: .    CAD with recurrent angina. S/p stenting of the LCx in 2021. Abnormal Myoview in Dec 2023. On good medical therapy with beta blocker and amlodipine. Discussed further evaluation. Recommend cardiac cath with possible PCI if indicated. Patient understands and is in agreement. The procedure and risks were reviewed including but not limited to death, myocardial infarction, stroke, arrythmias, bleeding, transfusion, emergency surgery, dye allergy, or renal dysfunction. The patient voices understanding and is agreeable to proceed.     Informed Consent   Shared Decision Making/Informed Consent The risks [stroke (1 in 1000), death (1 in 1000), kidney failure [usually temporary] (1 in 500), bleeding (1 in 200), allergic reaction [possibly serious] (1 in 200)], benefits (diagnostic support and management of coronary artery disease) and alternatives of a cardiac catheterization were discussed in detail with [name] and he is willing to proceed.       Signed, Nyashia Raney, MD   

## 2022-12-29 NOTE — Progress Notes (Signed)
Cardiology Office Note:  .   Date:  12/29/2022  ID:  Tommy Lopez, DOB 02/06/1951, MRN [id number] PCP: Adrian Prince, MD   [facility] Providers Cardiologist:  Olga Millers, MD    History of Present Illness: .   Tommy Lopez is a 72 y.o. male seen as a work in for evaluation of chest pain. Was seen by Dr Jens Som in May. Noted symptoms of URI. In March was treated with steroids, cough medication and doxycycline. When seen by Dr Jens Som was given Rx for Z pak and recommended follow up with PCP for persistent symptoms. He states he did have a cough for several weeks but then this resolved. He has a history of CAD with stent of LCx in 2021. OM occluded. Had Myoview in Dec showing  ischemia but was not having angina at the time so did not pursue this further.  Echo showed normal EF with AV sclerosis (no stenosis.)  Patient reports for several weeks he has experienced left precordial chest pain similar to when he had his last stent. States it is actually worse than before. It is worse with exertion such as walking or going up stairs. He is more fatigued. He notes some SOB with this. 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 Walks with a seeing eye dog but can't walk as far now. Did take Ntg once and this helped with his chest pain symptoms.  ROS: see above  Studies Reviewed: Marland Kitchen       Myoview 06/08/22: Study Highlights      Findings are consistent with ischemia. The study is intermediate risk.   No ST deviation was noted. The ECG was not diagnostic due to pharmacologic protocol.   LV perfusion is abnormal. Defect 1: There is a medium defect with moderate reduction in uptake present in the apical to basal anterolateral and inferolateral location(s) that is partially reversible. There is normal wall motion in the defect area. Consistent with ischemia. Defect 2: There is a small defect with moderate reduction in uptake present in the mid inferolateral location(s). There is normal wall motion in the defect  area. Consistent with artifact caused by diaphragmatic attenuation.   Left ventricular function is normal. Nuclear stress EF: 64 %. The left ventricular ejection fraction is normal (55-65%). End diastolic cavity size is normal. End systolic cavity size is normal.   Prior study available for comparison.   Two defects: one is likely artifact, with defect in the mid inferolateral area, worse at rest. There is significant extracardiac activity in this area with normal wall motion. Second defect is along the border of the anterolateral/inferolateral segments. This is most consistent with mild ischemia. Normal wall motion in this area. Intermediate risk as baseline uptake is abnormal, but this may be due to artifact   ECHO 06/08/22: IMPRESSIONS     1. Left ventricular ejection fraction, by estimation, is 65 to 70%. The  left ventricle has normal function. The left ventricle has no regional  wall motion abnormalities. Left ventricular diastolic parameters are  consistent with Grade I diastolic  dysfunction (impaired relaxation).   2. Right ventricular systolic function is normal. The right ventricular  size is normal. Tricuspid regurgitation signal is inadequate for assessing  PA pressure.   3. The mitral valve is abnormal. Trivial mitral valve regurgitation. No  evidence of mitral stenosis.   4. The aortic valve is tricuspid. There is moderate leaflet calcification  most notably on the NCC of the aortic valve. There is moderate thickening  of the aortic valve. Aortic valve regurgitation is trivial. Aortic valve  sclerosis/calcification is  present, without any evidence of aortic stenosis. Aortic valve area, by  VTI measures 2.06 cm. Aortic valve mean gradient measures 7.0 mmHg.  Aortic valve Vmax measures 1.84 m/s.   5. The inferior vena cava is normal in size with greater than 50%  respiratory variability, suggesting right atrial pressure of 3 mmHg.   Comparison(s): Compared to prior TTE on  01/2021, the mean aortic valve  gradient is compared to previously. There is aortic  sclerosis/calcification without significant stenosis.   Risk Assessment/Calculations:          Physical Exam:   VS:  BP 134/84   Pulse 76   Ht 5\' 6"  (1.676 m)   Wt 164 lb 12.8 oz (74.8 kg)   SpO2 96%   BMI 26.60 kg/m    Wt Readings from Last 3 Encounters:  12/29/22 164 lb 12.8 oz (74.8 kg)  11/09/22 160 lb (72.6 kg)  08/24/22 167 lb 12.8 oz (76.1 kg)    GEN: Well nourished, well developed in no acute distress NECK: No JVD; No carotid bruits CARDIAC: RRR, no murmurs, rubs, gallops RESPIRATORY:  Clear to auscultation without rales, wheezing or rhonchi  ABDOMEN: Soft, non-tender, non-distended EXTREMITIES:  No edema; No deformity   ASSESSMENT AND PLAN: .    CAD with recurrent angina. S/p stenting of the LCx in 2021. Abnormal Myoview in Dec 2023. On good medical therapy with beta blocker and amlodipine. Discussed further evaluation. Recommend cardiac cath with possible PCI if indicated. Patient understands and is in agreement. The procedure and risks were reviewed including but not limited to death, myocardial infarction, stroke, arrythmias, bleeding, transfusion, emergency surgery, dye allergy, or renal dysfunction. The patient voices understanding and is agreeable to proceed.     Informed Consent   Shared Decision Making/Informed Consent The risks [stroke (1 in 1000), death (1 in 1000), kidney failure [usually temporary] (1 in 500), bleeding (1 in 200), allergic reaction [possibly serious] (1 in 200)], benefits (diagnostic support and management of coronary artery disease) and alternatives of a cardiac catheterization were discussed in detail with Tommy Lopez and he is willing to proceed.       Signed, Jaleigh Mccroskey [place], MD

## 2022-12-29 NOTE — Patient Instructions (Signed)
Medication Instructions:  Continue same medications *If you need a refill on your cardiac medications before your next appointment, please call your pharmacy*   Lab Work: Bmet,cbc,pt today   Testing/Procedures: Cardiac cath scheduled Wed 7/10 Arrive a Mal[place] 6:30 am   Follow instructions below   Follow-Up: At [facility], you and your health needs are our priority.  As part of our continuing mission to provide you with exceptional heart care, we have created designated Provider Care Teams.  These Care Teams include your primary Cardiologist (physician) and Advanced Practice Providers (APPs -  Physician Assistants and Nurse Practitioners) who all work together to provide you with the care you need, when you need it.  We recommend signing up for the patient portal called "MyChart".  Sign up information is provided on this After Visit Summary.  MyChart is used to connect with patients for Virtual Visits (Telemedicine).  Patients are able to view lab/test results, encounter notes, upcoming appointments, etc.  Non-urgent messages can be sent to your provider as well.   To learn more about what you can do with MyChart, go to [email or web address].    Your next appointment:  After Cath    Provider:  [facility] DEPT OF [facility] AT [facility] [address] [place] [id number] [place] [address] Dept: [phone] Loc: [phone]  Tommy Lopez  12/29/2022  You are scheduled for a Cardiac Catheterization on Wednesday, July 10 with Dr. Peter Swaziland.  1. Please arrive at the [facility] (Main Entrance A) at [facility]: [address] [place], [place] [address] at 6:30 AM (This time is 2 hour(s) before your procedure to ensure your preparation). Free valet parking service is available. You will check in at ADMITTING. The support person will be asked to wait in the waiting  room.  It is OK to have someone drop you off and come back when you are ready to be discharged.    Special note: Every effort is made to have your procedure done on time. Please understand that emergencies sometimes delay scheduled procedures.  2. Diet: Do not eat solid foods after midnight.  The patient may have clear liquids until 5am upon the day of the procedure.  3. Labs: You will need to have blood drawn on Tues 6/25 at Dr.Jordan's office.You do not need to be fasting.  4. Medication instructions in preparation for your procedure:    Take 1/2 insulin dose the night before cath  Hold morning dose insulin      On the morning of your procedure, take your Aspirin 81 mg and any morning medicines NOT listed above.  You may use sips of water.  5. Plan to go home the same day, you will only stay overnight if medically necessary. 6. Bring a current list of your medications and current insurance cards. 7. You MUST have a responsible person to drive you home. 8. Someone MUST be with you the first 24 hours after you arrive home or your discharge will be delayed. 9. Please wear clothes that are easy to get on and off and wear slip-on shoes.  Thank you for allowing [place] to care for you!   -- [facility]

## 2022-12-30 LAB — CBC WITH DIFFERENTIAL/PLATELET
Basophils Absolute: 0 10*3/uL (ref 0.0–0.2)
Basos: 1 %
EOS (ABSOLUTE): 0.3 10*3/uL (ref 0.0–0.4)
Eos: 4 %
Hematocrit: 41.5 % (ref 37.5–51.0)
Hemoglobin: 13.5 g/dL (ref 13.0–17.7)
Immature Grans (Abs): 0 10*3/uL (ref 0.0–0.1)
Immature Granulocytes: 1 %
Lymphocytes Absolute: 1.1 10*3/uL (ref 0.7–3.1)
Lymphs: 15 %
MCH: 29.8 pg (ref 26.6–33.0)
MCHC: 32.5 g/dL (ref 31.5–35.7)
MCV: 92 fL (ref 79–97)
Monocytes Absolute: 0.6 10*3/uL (ref 0.1–0.9)
Monocytes: 9 %
Neutrophils Absolute: 5 10*3/uL (ref 1.4–7.0)
Neutrophils: 70 %
Platelets: 163 10*3/uL (ref 150–450)
RBC: 4.53 x10E6/uL (ref 4.14–5.80)
RDW: 11.9 % (ref 11.6–15.4)
WBC: 7 10*3/uL (ref 3.4–10.8)

## 2022-12-30 LAB — BASIC METABOLIC PANEL
BUN/Creatinine Ratio: 18 (ref 10–24)
BUN: 20 mg/dL (ref 8–27)
CO2: 25 mmol/L (ref 20–29)
Calcium: 9.6 mg/dL (ref 8.6–10.2)
Chloride: 103 mmol/L (ref 96–106)
Creatinine, Ser: 1.12 mg/dL (ref 0.76–1.27)
Glucose: 194 mg/dL — ABNORMAL HIGH (ref 70–99)
Potassium: 4.9 mmol/L (ref 3.5–5.2)
Sodium: 140 mmol/L (ref 134–144)
eGFR: 71 mL/min/{1.73_m2} (ref >59)

## 2022-12-30 LAB — PT AND PTT
INR: 1 (ref 0.9–1.2)
Prothrombin Time: 10.7 s (ref 9.1–12.0)
aPTT: 25 s (ref 24–33)

## 2023-01-12 ENCOUNTER — Telehealth: Payer: Self-pay | Admitting: *Deleted

## 2023-01-12 NOTE — Telephone Encounter (Signed)
Patient is blind in both eyes 

## 2023-01-12 NOTE — Telephone Encounter (Signed)
Cardiac Catheterization scheduled at [facility] for: Wednesday January 13, 2023 8:30 AM Arrival time [facility] Main Entrance A at: 6:30 AM  Nothing to eat after midnight prior to procedure, clear liquids until 5 AM day of procedure.  Medication instructions: -Hold:  Insulin-AM of procedure/1/2 usual Insulin dose evening prior to procedure   -Other usual morning medications can be taken with sips of water including aspirin 81 mg.  Confirmed patient has responsible adult to drive home post procedure and be with patient first 24 hours after arriving home.  Plan to go home the same day, you will only stay overnight if medically necessary.  Reviewed procedure instructions with patient.

## 2023-01-13 ENCOUNTER — Ambulatory Visit (HOSPITAL_COMMUNITY)
Admission: RE | Admit: 2023-01-13 | Discharge: 2023-01-13 | Disposition: A | Discharge status: Home / Self Care | Payer: Medicare Other | Attending: Cardiology | Admitting: Cardiology

## 2023-01-13 ENCOUNTER — Other Ambulatory Visit: Payer: Self-pay

## 2023-01-13 ENCOUNTER — Encounter (HOSPITAL_COMMUNITY)
Admission: RE | Disposition: A | Discharge status: Home / Self Care | Payer: Self-pay | Source: Home / Self Care | Attending: Cardiology

## 2023-01-13 DIAGNOSIS — I2510 Atherosclerotic heart disease of native coronary artery without angina pectoris: Secondary | ICD-10-CM

## 2023-01-13 DIAGNOSIS — I25119 Atherosclerotic heart disease of native coronary artery with unspecified angina pectoris: Secondary | ICD-10-CM | POA: Diagnosis not present

## 2023-01-13 DIAGNOSIS — Z79899 Other long term (current) drug therapy: Secondary | ICD-10-CM | POA: Diagnosis not present

## 2023-01-13 DIAGNOSIS — I209 Angina pectoris, unspecified: Secondary | ICD-10-CM

## 2023-01-13 DIAGNOSIS — I2584 Coronary atherosclerosis due to calcified coronary lesion: Secondary | ICD-10-CM | POA: Insufficient documentation

## 2023-01-13 DIAGNOSIS — I2582 Chronic total occlusion of coronary artery: Secondary | ICD-10-CM | POA: Diagnosis not present

## 2023-01-13 DIAGNOSIS — Z955 Presence of coronary angioplasty implant and graft: Secondary | ICD-10-CM | POA: Insufficient documentation

## 2023-01-13 HISTORY — PX: LEFT HEART CATH AND CORONARY ANGIOGRAPHY: CATH118249

## 2023-01-13 LAB — GLUCOSE, CAPILLARY
Glucose-Capillary: 133 mg/dL — ABNORMAL HIGH (ref 70–99)
Glucose-Capillary: 196 mg/dL — ABNORMAL HIGH (ref 70–99)

## 2023-01-13 SURGERY — LEFT HEART CATH AND CORONARY ANGIOGRAPHY
Anesthesia: LOCAL

## 2023-01-13 MED ORDER — LIDOCAINE HCL (PF) 1 % IJ SOLN
INTRAMUSCULAR | Status: DC | PRN
  Administered 2023-01-13: 5 mL

## 2023-01-13 MED ORDER — VERAPAMIL HCL 2.5 MG/ML IV SOLN
INTRAVENOUS | Status: AC
  Filled 2023-01-13: qty 2

## 2023-01-13 MED ORDER — IOHEXOL 350 MG/ML SOLN
INTRAVENOUS | Status: DC | PRN
  Administered 2023-01-13: 55 mL

## 2023-01-13 MED ORDER — SODIUM CHLORIDE 0.9 % WEIGHT BASED INFUSION: 1.0000 mL/kg/h | INTRAVENOUS | Status: DC

## 2023-01-13 MED ORDER — FENTANYL CITRATE (PF) 100 MCG/2ML IJ SOLN
INTRAMUSCULAR | Status: DC | PRN
  Administered 2023-01-13 (×2): 25 ug via INTRAVENOUS

## 2023-01-13 MED ORDER — SODIUM CHLORIDE 0.9% FLUSH: 3.0000 mL | INTRAVENOUS | Status: DC | PRN

## 2023-01-13 MED ORDER — ASPIRIN 81 MG PO CHEW: 81.0000 mg | CHEWABLE_TABLET | ORAL | Status: DC

## 2023-01-13 MED ORDER — HEPARIN (PORCINE) IN NACL 1000-0.9 UT/500ML-% IV SOLN
INTRAVENOUS | Status: DC | PRN
  Administered 2023-01-13 (×3): 500 mL

## 2023-01-13 MED ORDER — HEPARIN SODIUM (PORCINE) 1000 UNIT/ML IJ SOLN
INTRAMUSCULAR | Status: AC
  Filled 2023-01-13: qty 10

## 2023-01-13 MED ORDER — SODIUM CHLORIDE 0.9 % IV SOLN: 250.0000 mL | INTRAVENOUS | Status: DC | PRN

## 2023-01-13 MED ORDER — FENTANYL CITRATE (PF) 100 MCG/2ML IJ SOLN
INTRAMUSCULAR | Status: AC
  Filled 2023-01-13: qty 2

## 2023-01-13 MED ORDER — SODIUM CHLORIDE 0.9% FLUSH: 3.0000 mL | Freq: Two times a day (BID) | INTRAVENOUS | Status: DC

## 2023-01-13 MED ORDER — ACETAMINOPHEN 325 MG PO TABS: 650.0000 mg | ORAL_TABLET | ORAL | Status: DC | PRN

## 2023-01-13 MED ORDER — MIDAZOLAM HCL 2 MG/2ML IJ SOLN
INTRAMUSCULAR | Status: AC
  Filled 2023-01-13: qty 2

## 2023-01-13 MED ORDER — SODIUM CHLORIDE 0.9 % WEIGHT BASED INFUSION
3.0000 mL/kg/h | INTRAVENOUS | Status: AC
  Administered 2023-01-13: 3 mL/kg/h via INTRAVENOUS

## 2023-01-13 MED ORDER — VERAPAMIL HCL 2.5 MG/ML IV SOLN
INTRAVENOUS | Status: DC | PRN
  Administered 2023-01-13: 100 mL via INTRA_ARTERIAL

## 2023-01-13 MED ORDER — ONDANSETRON HCL 4 MG/2ML IJ SOLN: 4.0000 mg | Freq: Four times a day (QID) | INTRAMUSCULAR | Status: DC | PRN

## 2023-01-13 MED ORDER — HEPARIN SODIUM (PORCINE) 1000 UNIT/ML IJ SOLN
INTRAMUSCULAR | Status: DC | PRN
  Administered 2023-01-13: 3500 [IU] via INTRAVENOUS

## 2023-01-13 MED ORDER — MIDAZOLAM HCL 2 MG/2ML IJ SOLN
INTRAMUSCULAR | Status: DC | PRN
  Administered 2023-01-13 (×2): 1 mg via INTRAVENOUS

## 2023-01-13 MED ORDER — LIDOCAINE HCL (PF) 1 % IJ SOLN
INTRAMUSCULAR | Status: AC
  Filled 2023-01-13: qty 30

## 2023-01-13 SURGICAL SUPPLY — 9 items
CATH 5FR JL3.5 JR4 ANG PIG MP (CATHETERS) IMPLANT
DEVICE RAD COMP TR BAND LRG (VASCULAR PRODUCTS) IMPLANT
GLIDESHEATH SLEND SS 6F .021 (SHEATH) IMPLANT
GUIDEWIRE INQWIRE 1.5J.035X260 (WIRE) IMPLANT
INQWIRE 1.5J .035X260CM (WIRE) ×1
KIT HEART LEFT (KITS) ×1 IMPLANT
PACK CARDIAC CATHETERIZATION (CUSTOM PROCEDURE TRAY) ×1 IMPLANT
TRANSDUCER W/STOPCOCK (MISCELLANEOUS) ×1 IMPLANT
TUBING CIL FLEX 10 FLL-RA (TUBING) ×1 IMPLANT

## 2023-01-13 NOTE — Discharge Instructions (Signed)

## 2023-01-13 NOTE — Interval H&P Note (Signed)
History and Physical Interval Note:  01/13/2023 8:15 AM  Tommy Lopez  has presented today for surgery, with the diagnosis of chest pain.  The various methods of treatment have been discussed with the patient and family. After consideration of risks, benefits and other options for treatment, the patient has consented to  Procedure(s): LEFT HEART CATH AND CORONARY ANGIOGRAPHY (N/A) as a surgical intervention.  The patient's history has been reviewed, patient examined, no change in status, stable for surgery.  I have reviewed the patient's chart and labs.  Questions were answered to the patient's satisfaction.   Cath Lab Visit (complete for each Cath Lab visit)  Clinical Evaluation Leading to the Procedure:   ACS: No.  Non-ACS:    Anginal Classification: CCS III  Anti-ischemic medical therapy: Maximal Therapy (2 or more classes of medications)  Non-Invasive Test Results: Intermediate-risk stress test findings: cardiac mortality 1-3%/year  Prior CABG: No previous CABG        Theron Arista [facility] 01/13/2023 8:15 AM

## 2023-01-14 ENCOUNTER — Encounter (HOSPITAL_COMMUNITY): Payer: Self-pay | Admitting: Cardiology

## 2023-01-20 NOTE — Progress Notes (Signed)
HPI: FU coronary artery disease. Carotid Dopplers October 2019 showed no hemodynamically significant stenosis. Patient had PCI of circumflex with drug-eluting stent 2/21. Lipitor changed to Crestor at previous office visit due to myalgias. Echocardiogram December 2023 showed normal LV function, grade 1 diastolic dysfunction, trace aortic insufficiency. Cardiac catheterization July 2024 showed 60% second diagonal, occluded marginal, 50% RCA and nonstenotic proximal circumflex to mid circumflex lesion previously treated.  LV function normal as was left ventricular end-diastolic pressure.  CTA April 2024 showed no acute large vessel occlusion or significant stenosis; coarse calcification noted but less than 50% bilaterally.  Since last seen, he continues to have a "uncomfortable feeling" in his chest usually in the evenings.  He denies dyspnea or syncope.  Current Outpatient Medications  Medication Sig Dispense Refill   amLODipine (NORVASC) 2.5 MG tablet Take 1 tablet (2.5 mg total) by mouth daily. 90 tablet 3   aspirin EC 81 MG tablet Take 81 mg by mouth in the morning.     benzonatate (TESSALON) 200 MG capsule Take 200 mg by mouth 2 (two) times daily as needed for cough.     diazepam (VALIUM) 2 MG tablet Take 2 mg by mouth daily as needed (dizziness).      diphenhydramine-acetaminophen (TYLENOL PM) 25-500 MG TABS tablet Take 1 tablet by mouth at bedtime.     doxepin (SINEQUAN) 10 MG capsule Take 20 mg by mouth daily.     ezetimibe (ZETIA) 10 MG tablet Take 1 tablet (10 mg total) by mouth daily. (Patient taking differently: Take 10 mg by mouth every evening.) 90 tablet 3   finasteride (PROSCAR) 5 MG tablet Take 5 mg by mouth every evening.     fluticasone (FLONASE) 50 MCG/ACT nasal spray Place 1-2 sprays into both nostrils every evening.     insulin lispro (HUMALOG) 100 UNIT/ML injection Inject 5-16 Units into the skin as directed. 5-12 units in the morning 12-16 units in the evening     insulin  NPH Human (NOVOLIN N) 100 UNIT/ML injection Inject 10-14 Units into the skin in the morning and at bedtime. Mix with Humalog     loratadine (CLARITIN) 10 MG tablet Take 10 mg by mouth in the morning.     methocarbamol (ROBAXIN) 500 MG tablet Take 500 mg by mouth 3 (three) times daily as needed for muscle spasms.     metoprolol succinate (TOPROL-XL) 25 MG 24 hr tablet Take 1 tablet (25 mg total) by mouth daily. 90 tablet 3   mupirocin ointment (BACTROBAN) 2 % Apply 1 application topically daily as needed (Skin irritation).      nitroGLYCERIN (NITROSTAT) 0.4 MG SL tablet Place 1 tablet (0.4 mg total) under the tongue every 5 (five) minutes as needed for chest pain (chest pain). 25 tablet 4   OVER THE COUNTER MEDICATION Take 1 capsule by mouth daily. Beet Root Extract Supplement     pantoprazole (PROTONIX) 40 MG tablet Take 40 mg by mouth every evening.     polyethylene glycol powder (GLYCOLAX/MIRALAX) 17 GM/SCOOP powder Take 17 g by mouth daily as needed (regularity).     pravastatin (PRAVACHOL) 40 MG tablet Take 1 tablet (40 mg total) by mouth every evening. 90 tablet 3   tamsulosin (FLOMAX) 0.4 MG CAPS capsule Take 0.4 mg by mouth at bedtime.      Vitamin D, Ergocalciferol, (DRISDOL) 1.25 MG (50000 UNIT) CAPS capsule Take 50,000 Units by mouth every Monday.     zolpidem (AMBIEN) 10 MG tablet Take 10  mg by mouth at bedtime.     No current facility-administered medications for this visit.     Past Medical History:  Diagnosis Date   Atherosclerotic coronary vascular disease    SINGLE VESSEL OCCULSIVE   Diabetes mellitus    Hyperlipidemia    Hypertension    IHD (ischemic heart disease)    SOB (shortness of breath) on exertion     Past Surgical History:  Procedure Laterality Date   CARDIAC CATHETERIZATION  08/03/2002   NORMAL LEFT VENTRICULAR SIZE AND EXCELLENT CONTRACTILITY WITH NO SEGMENTAL WALL ABNORMALITIES. EF 70-75%   CARDIAC CATHETERIZATION  08/29/2019   CORONARY STENT INTERVENTION  N/A 08/29/2019   Procedure: CORONARY STENT INTERVENTION;  Surgeon: Corky Crafts, MD;  Location: [facility] INVASIVE CV LAB;  Service: Cardiovascular;  Laterality: N/A;   CORONARY STENT PLACEMENT  08/29/2019   CORONARY ULTRASOUND/IVUS N/A 08/29/2019   Procedure: Intravascular Ultrasound/IVUS;  Surgeon: Corky Crafts, MD;  Location: [facility] INVASIVE CV LAB;  Service: Cardiovascular;  Laterality: N/A;   EYE SURGERY     LEFT HEART CATH AND CORONARY ANGIOGRAPHY N/A 08/29/2019   Procedure: LEFT HEART CATH AND CORONARY ANGIOGRAPHY;  Surgeon: Corky Crafts, MD;  Location: [facility] INVASIVE CV LAB;  Service: Cardiovascular;  Laterality: N/A;   LEFT HEART CATH AND CORONARY ANGIOGRAPHY N/A 01/13/2023   Procedure: LEFT HEART CATH AND CORONARY ANGIOGRAPHY;  Surgeon: Swaziland, Peter M, MD;  Location: [facility] INVASIVE CV LAB;  Service: Cardiovascular;  Laterality: N/A;    Social History   Socioeconomic History   Marital status: Married    Spouse name: Not on file   Number of children: Not on file   Years of education: Not on file   Highest education level: Not on file  Occupational History   Not on file  Tobacco Use   Smoking status: Never   Smokeless tobacco: Never  Vaping Use   Vaping status: Never Used  Substance and Sexual Activity   Alcohol use: No   Drug use: No   Sexual activity: Not on file  Other Topics Concern   Not on file  Social History Narrative   Not on file   Social Determinants of Health   Financial Resource Strain: Low Risk  (09/18/2022)   Received from [facility], [facility]   Overall Financial Resource Strain (CARDIA)    Difficulty of Paying Living Expenses: Not hard at all  Food Insecurity: No Food Insecurity (09/18/2022)   Received from [facility], [facility]   Hunger Vital Sign    Worried About Running Out of Food in the Last Year: Never true    Ran Out of Food in the Last Year: Never true  Transportation Needs: No Transportation Needs (09/18/2022)   Received  from [facility], [facility]   PRAPARE - Transportation    Lack of Transportation (Medical): No    Lack of Transportation (Non-Medical): No  Physical Activity: Not on file  Stress: Not on file  Social Connections: Unknown (11/14/2021)   Received from [facility], [facility]   Social Network    Social Network: Not on file  Intimate Partner Violence: Unknown (10/06/2021)   Received from [facility], [facility]   HITS    Physically Hurt: Not on file    Insult or Talk Down To: Not on file    Threaten Physical Harm: Not on file    Scream or Curse: Not on file    Family History  Problem Relation Age of Onset   Arthritis  Mother    Heart disease Father     ROS: no fevers or chills, productive cough, hemoptysis, dysphasia, odynophagia, melena, hematochezia, dysuria, hematuria, rash, seizure activity, orthopnea, PND, pedal edema, claudication. Remaining systems are negative.  Physical Exam: Well-developed well-nourished in no acute distress.  Skin is warm and dry.  HEENT is normal.  Neck is supple.  Chest is clear to auscultation with normal expansion.  Cardiovascular exam is regular rate and rhythm.  Abdominal exam nontender or distended. No masses palpated. Extremities show no edema. neuro grossly intact   A/P  1 coronary artery disease-recent cardiac catheterization as outlined above and medical therapy recommended.  Previously treated circumflex lesion nonstenotic.  Continue aspirin, beta-blocker and statin.  2 hypertension-patient's blood pressure is controlled.  Continue present medications.  3 hyperlipidemia-continue pravastatin and Zetia.  Check lipids and liver.  If not at goal he may benefit from a PCSK9 inhibitor.  He did not tolerate Lipitor previously.  4 chest pain-patient has had a "uncomfortable feeling" in his chest that continues.  However recent catheterization revealed no obstructive coronary disease.  Will continue to follow.  5 question history  of aortic stenosis-mild on previous echocardiogram but follow-up showed sclerotic aortic valve.  No significant aortic stenosis by physical examination.  May need follow-up echoes in the future.  6 carotid artery disease-less than 50% bilaterally.  Olga Millers, MD

## 2023-01-22 ENCOUNTER — Other Ambulatory Visit: Payer: Self-pay | Admitting: Cardiology

## 2023-01-22 DIAGNOSIS — I2510 Atherosclerotic heart disease of native coronary artery without angina pectoris: Secondary | ICD-10-CM

## 2023-01-27 ENCOUNTER — Encounter: Payer: Self-pay | Admitting: Cardiology

## 2023-01-27 ENCOUNTER — Ambulatory Visit: Payer: Medicare Other | Admitting: Cardiology

## 2023-01-27 VITALS — BP 139/59 | HR 67 | Ht 65.5 in | Wt 163.0 lb

## 2023-01-27 DIAGNOSIS — I25119 Atherosclerotic heart disease of native coronary artery with unspecified angina pectoris: Secondary | ICD-10-CM

## 2023-01-27 DIAGNOSIS — R072 Precordial pain: Secondary | ICD-10-CM

## 2023-01-27 DIAGNOSIS — E785 Hyperlipidemia, unspecified: Secondary | ICD-10-CM | POA: Diagnosis not present

## 2023-01-27 DIAGNOSIS — I2510 Atherosclerotic heart disease of native coronary artery without angina pectoris: Secondary | ICD-10-CM

## 2023-01-27 DIAGNOSIS — I10 Essential (primary) hypertension: Secondary | ICD-10-CM

## 2023-01-27 MED ORDER — METOPROLOL SUCCINATE ER 25 MG PO TB24
25.0000 mg | ORAL_TABLET | Freq: Every day | ORAL | 3 refills | Status: DC
Start: 2023-01-27 — End: 2023-10-11

## 2023-01-27 MED ORDER — NITROGLYCERIN 0.4 MG SL SUBL
0.4000 mg | SUBLINGUAL_TABLET | SUBLINGUAL | 4 refills | Status: DC | PRN
Start: 2023-01-27 — End: 2023-12-29

## 2023-01-27 NOTE — Patient Instructions (Signed)

## 2023-01-28 ENCOUNTER — Ambulatory Visit: Payer: Medicare Other | Admitting: Nurse Practitioner

## 2023-02-11 ENCOUNTER — Encounter: Payer: Self-pay | Admitting: *Deleted

## 2023-02-12 ENCOUNTER — Other Ambulatory Visit: Payer: Self-pay | Admitting: Cardiology

## 2023-04-23 ENCOUNTER — Other Ambulatory Visit: Payer: Self-pay | Admitting: Cardiology

## 2023-05-10 NOTE — Progress Notes (Signed)
HPI: FU coronary artery disease. Carotid Dopplers October 2019 showed no hemodynamically significant stenosis. Patient had PCI of circumflex with drug-eluting stent 2/21. Lipitor changed to Crestor at previous office visit due to myalgias. Echocardiogram December 2023 showed normal LV function, grade 1 diastolic dysfunction, trace aortic insufficiency. Cardiac catheterization July 2024 showed 60% second diagonal, occluded marginal, 50% RCA and nonstenotic proximal circumflex to mid circumflex lesion previously treated.  LV function normal as was left ventricular end-diastolic pressure.  CTA April 2024 showed no acute large vessel occlusion or significant stenosis; coarse calcification noted but less than 50% bilaterally.  Since last seen, he continues to have occasional tightness in his left chest area that he attributes to being pulled by his dog.  It is unchanged and been present chronically.  Dyspnea with more vigorous activities but not routine activities.  Occasional dizziness with standing but no syncope.  Current Outpatient Medications  Medication Sig Dispense Refill   amLODipine (NORVASC) 2.5 MG tablet Take 1 tablet (2.5 mg total) by mouth daily. 90 tablet 2   aspirin EC 81 MG tablet Take 81 mg by mouth in the morning.     benzonatate (TESSALON) 200 MG capsule Take 200 mg by mouth 2 (two) times daily as needed for cough.     diazepam (VALIUM) 2 MG tablet Take 2 mg by mouth daily as needed (dizziness).      diphenhydramine-acetaminophen (TYLENOL PM) 25-500 MG TABS tablet Take 1 tablet by mouth at bedtime.     doxepin (SINEQUAN) 10 MG capsule Take 20 mg by mouth daily.     ezetimibe (ZETIA) 10 MG tablet Take 1 tablet (10 mg total) by mouth daily. 90 tablet 3   finasteride (PROSCAR) 5 MG tablet Take 5 mg by mouth every evening.     fluticasone (FLONASE) 50 MCG/ACT nasal spray Place 1-2 sprays into both nostrils every evening.     insulin lispro (HUMALOG) 100 UNIT/ML injection Inject 5-16  Units into the skin as directed. 5-12 units in the morning 12-16 units in the evening     insulin NPH Human (NOVOLIN N) 100 UNIT/ML injection Inject 10-14 Units into the skin in the morning and at bedtime. Mix with Humalog     loratadine (CLARITIN) 10 MG tablet Take 10 mg by mouth in the morning.     methocarbamol (ROBAXIN) 500 MG tablet Take 500 mg by mouth 3 (three) times daily as needed for muscle spasms.     metoprolol succinate (TOPROL-XL) 25 MG 24 hr tablet Take 1 tablet (25 mg total) by mouth daily. 90 tablet 3   mupirocin ointment (BACTROBAN) 2 % Apply 1 application topically daily as needed (Skin irritation).      nitroGLYCERIN (NITROSTAT) 0.4 MG SL tablet Place 1 tablet (0.4 mg total) under the tongue every 5 (five) minutes as needed for chest pain (chest pain). 25 tablet 4   OVER THE COUNTER MEDICATION Take 1 capsule by mouth daily. Beet Root Extract Supplement     pantoprazole (PROTONIX) 40 MG tablet Take 40 mg by mouth every evening.     polyethylene glycol powder (GLYCOLAX/MIRALAX) 17 GM/SCOOP powder Take 17 g by mouth daily as needed (regularity).     pravastatin (PRAVACHOL) 40 MG tablet Take 1 tablet (40 mg total) by mouth every evening. 90 tablet 3   tamsulosin (FLOMAX) 0.4 MG CAPS capsule Take 0.4 mg by mouth at bedtime.      Vitamin D, Ergocalciferol, (DRISDOL) 1.25 MG (50000 UNIT) CAPS capsule Take 50,000  Units by mouth every Monday.     zolpidem (AMBIEN) 10 MG tablet Take 10 mg by mouth at bedtime.     No current facility-administered medications for this visit.     Past Medical History:  Diagnosis Date   Atherosclerotic coronary vascular disease    SINGLE VESSEL OCCULSIVE   Diabetes mellitus    Hyperlipidemia    Hypertension    IHD (ischemic heart disease)    SOB (shortness of breath) on exertion     Past Surgical History:  Procedure Laterality Date   CARDIAC CATHETERIZATION  08/03/2002   NORMAL LEFT VENTRICULAR SIZE AND EXCELLENT CONTRACTILITY WITH NO SEGMENTAL  WALL ABNORMALITIES. EF 70-75%   CARDIAC CATHETERIZATION  08/29/2019   CORONARY STENT INTERVENTION N/A 08/29/2019   Procedure: CORONARY STENT INTERVENTION;  Surgeon: Corky Crafts, MD;  Location: [facility] INVASIVE CV LAB;  Service: Cardiovascular;  Laterality: N/A;   CORONARY STENT PLACEMENT  08/29/2019   CORONARY ULTRASOUND/IVUS N/A 08/29/2019   Procedure: Intravascular Ultrasound/IVUS;  Surgeon: Corky Crafts, MD;  Location: [facility] INVASIVE CV LAB;  Service: Cardiovascular;  Laterality: N/A;   EYE SURGERY     LEFT HEART CATH AND CORONARY ANGIOGRAPHY N/A 08/29/2019   Procedure: LEFT HEART CATH AND CORONARY ANGIOGRAPHY;  Surgeon: Corky Crafts, MD;  Location: [facility] INVASIVE CV LAB;  Service: Cardiovascular;  Laterality: N/A;   LEFT HEART CATH AND CORONARY ANGIOGRAPHY N/A 01/13/2023   Procedure: LEFT HEART CATH AND CORONARY ANGIOGRAPHY;  Surgeon: Swaziland, Peter M, MD;  Location: [facility] INVASIVE CV LAB;  Service: Cardiovascular;  Laterality: N/A;    Social History   Socioeconomic History   Marital status: Married    Spouse name: Not on file   Number of children: Not on file   Years of education: Not on file   Highest education level: Not on file  Occupational History   Not on file  Tobacco Use   Smoking status: Never   Smokeless tobacco: Never  Vaping Use   Vaping status: Never Used  Substance and Sexual Activity   Alcohol use: No   Drug use: No   Sexual activity: Not on file  Other Topics Concern   Not on file  Social History Narrative   Not on file   Social Determinants of Health   Financial Resource Strain: Low Risk  (09/18/2022)   Received from [facility], [facility]   Overall Financial Resource Strain (CARDIA)    Difficulty of Paying Living Expenses: Not hard at all  Food Insecurity: No Food Insecurity (09/18/2022)   Received from [facility], [facility]   Hunger Vital Sign    Worried About Running Out of Food in the Last Year: Never true    Ran Out of Food  in the Last Year: Never true  Transportation Needs: No Transportation Needs (09/18/2022)   Received from [facility], [facility]   PRAPARE - Transportation    Lack of Transportation (Medical): No    Lack of Transportation (Non-Medical): No  Physical Activity: Not on file  Stress: Not on file  Social Connections: Unknown (11/14/2021)   Received from [facility], [facility]   Social Network    Social Network: Not on file  Intimate Partner Violence: Unknown (10/06/2021)   Received from [facility], [facility]   HITS    Physically Hurt: Not on file    Insult or Talk Down To: Not on file    Threaten Physical Harm: Not on file    Scream or Curse: Not  on file    Family History  Problem Relation Age of Onset   Arthritis Mother    Heart disease Father     ROS: no fevers or chills, productive cough, hemoptysis, dysphasia, odynophagia, melena, hematochezia, dysuria, hematuria, rash, seizure activity, orthopnea, PND, pedal edema, claudication. Remaining systems are negative.  Physical Exam: Well-developed well-nourished in no acute distress.  Skin is warm and dry.  HEENT is normal.  Neck is supple.  Chest is clear to auscultation with normal expansion.  Cardiovascular exam is regular rate and rhythm.  Abdominal exam nontender or distended. No masses palpated. Extremities show no edema. neuro grossly intact  EKG Interpretation Date/Time:  Monday May 24 2023 09:21:35 EST Ventricular Rate:  66 PR Interval:  144 QRS Duration:  100 QT Interval:  404 QTC Calculation: 423 R Axis:   8  Text Interpretation: Normal sinus rhythm Incomplete right bundle branch block When compared with ECG of 13-Jan-2023 06:52, No significant change was found Confirmed by Olga Millers ([id number]) on 05/24/2023 9:22:48 AM    A/P  1 coronary artery disease-patient has had difficulties with occasional chest pain in the past.  Last recent catheterization in July is as outlined in HPI.  Plan to  continue medical therapy including aspirin, statin and beta-blocker.  2 hypertension-blood pressure mildly elevated; however he states typically controlled at home and also some orthostatic symptoms.  Will continue present medications and follow.  3 hyperlipidemia-patient did not tolerate Lipitor in the past.  Will continue pravastatin and Zetia.  4 chest pain-symptoms are somewhat chronic.  Recent catheterization as outlined in July.  Plan will be medical therapy.  5 carotid artery disease-less than 50% bilaterally.  Olga Millers, MD

## 2023-05-24 ENCOUNTER — Encounter: Payer: Self-pay | Admitting: Cardiology

## 2023-05-24 ENCOUNTER — Ambulatory Visit: Payer: Medicare Other | Admitting: Cardiology

## 2023-05-24 VITALS — BP 142/58 | HR 70 | Ht 65.5 in | Wt 173.0 lb

## 2023-05-24 DIAGNOSIS — I2510 Atherosclerotic heart disease of native coronary artery without angina pectoris: Secondary | ICD-10-CM | POA: Diagnosis not present

## 2023-05-24 DIAGNOSIS — E785 Hyperlipidemia, unspecified: Secondary | ICD-10-CM

## 2023-05-24 DIAGNOSIS — I10 Essential (primary) hypertension: Secondary | ICD-10-CM

## 2023-05-24 DIAGNOSIS — R072 Precordial pain: Secondary | ICD-10-CM

## 2023-05-24 DIAGNOSIS — Z09 Encounter for follow-up examination after completed treatment for conditions other than malignant neoplasm: Secondary | ICD-10-CM

## 2023-05-24 NOTE — Patient Instructions (Signed)

## 2023-10-09 ENCOUNTER — Other Ambulatory Visit: Payer: Self-pay | Admitting: Cardiology

## 2023-10-09 DIAGNOSIS — I2510 Atherosclerotic heart disease of native coronary artery without angina pectoris: Secondary | ICD-10-CM

## 2023-11-12 ENCOUNTER — Other Ambulatory Visit: Payer: Self-pay | Admitting: Cardiology

## 2023-11-12 DIAGNOSIS — E7800 Pure hypercholesterolemia, unspecified: Secondary | ICD-10-CM

## 2023-12-21 NOTE — Progress Notes (Signed)
 HPI: FU coronary artery disease. Carotid Dopplers October 2019 showed no hemodynamically significant stenosis. Patient had PCI of circumflex with drug-eluting stent 2/21. Lipitor changed to Crestor at previous office visit due to myalgias. Echocardiogram December 2023 showed normal LV function, grade 1 diastolic dysfunction, trace aortic insufficiency. Cardiac catheterization July 2024 showed 60% second diagonal, occluded marginal, 50% RCA and nonstenotic proximal circumflex to mid circumflex lesion previously treated.  LV function normal as was left ventricular end-diastolic pressure.  CTA April 2024 showed no acute large vessel occlusion or significant stenosis; coarse calcification noted but less than 50% bilaterally.  Since last seen, patient has some dyspnea on exertion.  Occasional chest pain relieved with nitroglycerin  unchanged.  No syncope.  Current Outpatient Medications  Medication Sig Dispense Refill   amLODipine  (NORVASC ) 2.5 MG tablet Take 1 tablet (2.5 mg total) by mouth daily. 90 tablet 2   aspirin  EC 81 MG tablet Take 81 mg by mouth in the morning.     benzonatate  (TESSALON ) 200 MG capsule Take 200 mg by mouth 2 (two) times daily as needed for cough.     diazepam  (VALIUM ) 2 MG tablet Take 2 mg by mouth daily as needed (dizziness).      diphenhydramine-acetaminophen  (TYLENOL  PM) 25-500 MG TABS tablet Take 1 tablet by mouth at bedtime.     doxepin (SINEQUAN) 10 MG capsule Take 20 mg by mouth daily.     ezetimibe  (ZETIA ) 10 MG tablet Take 1 tablet (10 mg total) by mouth daily. 90 tablet 3   finasteride (PROSCAR) 5 MG tablet Take 5 mg by mouth every evening.     fluticasone (FLONASE) 50 MCG/ACT nasal spray Place 1-2 sprays into both nostrils every evening.     insulin  lispro (HUMALOG ) 100 UNIT/ML injection Inject 5-16 Units into the skin as directed. 5-12 units in the morning 12-16 units in the evening     insulin  NPH Human (NOVOLIN N) 100 UNIT/ML injection Inject 10-14 Units into  the skin in the morning and at bedtime. Mix with Humalog      loratadine  (CLARITIN ) 10 MG tablet Take 10 mg by mouth in the morning.     methocarbamol (ROBAXIN) 500 MG tablet Take 500 mg by mouth 3 (three) times daily as needed for muscle spasms.     metoprolol  succinate (TOPROL -XL) 25 MG 24 hr tablet Take 1 tablet (25 mg total) by mouth daily. 90 tablet 2   mupirocin  ointment (BACTROBAN ) 2 % Apply 1 application topically daily as needed (Skin irritation).      nitroGLYCERIN  (NITROSTAT ) 0.4 MG SL tablet Place 1 tablet (0.4 mg total) under the tongue every 5 (five) minutes as needed for chest pain (chest pain). 25 tablet 4   OVER THE COUNTER MEDICATION Take 1 capsule by mouth daily. Beet Root Extract Supplement     pantoprazole (PROTONIX) 40 MG tablet Take 40 mg by mouth every evening.     polyethylene glycol powder (GLYCOLAX/MIRALAX) 17 GM/SCOOP powder Take 17 g by mouth daily as needed (regularity).     pravastatin  (PRAVACHOL ) 40 MG tablet Take 1 tablet (40 mg total) by mouth every evening. 90 tablet 1   tamsulosin  (FLOMAX ) 0.4 MG CAPS capsule Take 0.4 mg by mouth at bedtime.      Vitamin D, Ergocalciferol, (DRISDOL) 1.25 MG (50000 UNIT) CAPS capsule Take 50,000 Units by mouth every Monday.     zolpidem  (AMBIEN ) 10 MG tablet Take 10 mg by mouth at bedtime.     No current facility-administered medications  for this visit.     Past Medical History:  Diagnosis Date   Atherosclerotic coronary vascular disease    SINGLE VESSEL OCCULSIVE   Diabetes mellitus    Hyperlipidemia    Hypertension    IHD (ischemic heart disease)    SOB (shortness of breath) on exertion     Past Surgical History:  Procedure Laterality Date   CARDIAC CATHETERIZATION  08/03/2002   NORMAL LEFT VENTRICULAR SIZE AND EXCELLENT CONTRACTILITY WITH NO SEGMENTAL WALL ABNORMALITIES. EF 70-75%   CARDIAC CATHETERIZATION  08/29/2019   CORONARY STENT INTERVENTION N/A 08/29/2019   Procedure: CORONARY STENT INTERVENTION;  Surgeon:  Dann Candyce RAMAN, MD;  Location: [facility] INVASIVE CV LAB;  Service: Cardiovascular;  Laterality: N/A;   CORONARY STENT PLACEMENT  08/29/2019   CORONARY ULTRASOUND/IVUS N/A 08/29/2019   Procedure: Intravascular Ultrasound/IVUS;  Surgeon: Dann Candyce RAMAN, MD;  Location: [facility] INVASIVE CV LAB;  Service: Cardiovascular;  Laterality: N/A;   EYE SURGERY     LEFT HEART CATH AND CORONARY ANGIOGRAPHY N/A 08/29/2019   Procedure: LEFT HEART CATH AND CORONARY ANGIOGRAPHY;  Surgeon: Dann Candyce RAMAN, MD;  Location: [facility] INVASIVE CV LAB;  Service: Cardiovascular;  Laterality: N/A;   LEFT HEART CATH AND CORONARY ANGIOGRAPHY N/A 01/13/2023   Procedure: LEFT HEART CATH AND CORONARY ANGIOGRAPHY;  Surgeon: [place], Peter M, MD;  Location: [facility] INVASIVE CV LAB;  Service: Cardiovascular;  Laterality: N/A;    Social History   Socioeconomic History   Marital status: Married    Spouse name: Not on file   Number of children: Not on file   Years of education: Not on file   Highest education level: Not on file  Occupational History   Not on file  Tobacco Use   Smoking status: Never   Smokeless tobacco: Never  Vaping Use   Vaping status: Never Used  Substance and Sexual Activity   Alcohol use: No   Drug use: No   Sexual activity: Not on file  Other Topics Concern   Not on file  Social History Narrative   Not on file   Social Drivers of Health   Financial Resource Strain: Low Risk  (12/10/2023)   Received from [facility]   Overall Financial Resource Strain (CARDIA)    Difficulty of Paying Living Expenses: Not hard at all  Food Insecurity: No Food Insecurity (12/10/2023)   Received from [facility]   Hunger Vital Sign    Within the past 12 months, you worried that your food would run out before you got the money to buy more.: Never true    Within the past 12 months, the food you bought just didn't last and you didn't have money to get more.: Never true  Transportation Needs: No Transportation Needs  (12/10/2023)   Received from [facility] - Transportation    Lack of Transportation (Medical): No    Lack of Transportation (Non-Medical): No  Physical Activity: Not on file  Stress: Not on file  Social Connections: Unknown (11/14/2021)   Received from [facility]   Social Network    Social Network: Not on file  Intimate Partner Violence: Unknown (10/06/2021)   Received from [facility]   HITS    Physically Hurt: Not on file    Insult or Talk Down To: Not on file    Threaten Physical Harm: Not on file    Scream or Curse: Not on file    Family History  Problem Relation Age of Onset   Arthritis  Mother    Heart disease Father     ROS: no fevers or chills, productive cough, hemoptysis, dysphasia, odynophagia, melena, hematochezia, dysuria, hematuria, rash, seizure activity, orthopnea, PND, pedal edema, claudication. Remaining systems are negative.  Physical Exam: Well-developed well-nourished in no acute distress.  Skin is warm and dry.  HEENT is normal.  Neck is supple.  Chest is clear to auscultation with normal expansion.  Cardiovascular exam is regular rate and rhythm.  Abdominal exam nontender or distended. No masses palpated. Extremities show no edema. neuro grossly intact  EKG Interpretation Date/Time:  Wednesday December 29 2023 14:13:20 EDT Ventricular Rate:  74 PR Interval:  136 QRS Duration:  94 QT Interval:  376 QTC Calculation: 417 R Axis:   18  Text Interpretation: Normal sinus rhythm Incomplete right bundle branch block Confirmed by Pietro Rogue ([id number]) on 12/29/2023 2:29:50 PM    A/P  1 coronary artery disease-plan to continue aspirin , beta-blocker and statin.  Previous catheterization July 2024 as outlined in HPI.  Plan is medical therapy.  He has had difficulties with occasional chest pain in the past.  2 hyperlipidemia-previously did not tolerate Lipitor.  Continue pravastatin  and Zetia .  3 hypertension-blood pressure is controlled.   Continue present medical regimen.  4 chest pain-patient has had difficulties with occasional atypical chest pain previously.  Most recent catheterization revealed nonobstructive coronary disease.  Plan to continue medical therapy.  Note electrocardiogram shows no diagnostic ST changes.  5 carotid artery disease-less than 50% bilaterally.  Rogue Pietro, MD

## 2023-12-29 ENCOUNTER — Ambulatory Visit: Payer: Medicare Other | Admitting: Cardiology

## 2023-12-29 ENCOUNTER — Encounter: Payer: Self-pay | Admitting: Cardiology

## 2023-12-29 VITALS — BP 130/62 | HR 74 | Ht 65.5 in

## 2023-12-29 DIAGNOSIS — I2510 Atherosclerotic heart disease of native coronary artery without angina pectoris: Secondary | ICD-10-CM | POA: Diagnosis not present

## 2023-12-29 DIAGNOSIS — R072 Precordial pain: Secondary | ICD-10-CM

## 2023-12-29 DIAGNOSIS — E785 Hyperlipidemia, unspecified: Secondary | ICD-10-CM

## 2023-12-29 DIAGNOSIS — I10 Essential (primary) hypertension: Secondary | ICD-10-CM

## 2023-12-29 MED ORDER — NITROGLYCERIN 0.4 MG SL SUBL: 0.4000 mg | SUBLINGUAL_TABLET | SUBLINGUAL | 4 refills | Status: AC | PRN

## 2023-12-29 NOTE — Patient Instructions (Signed)

## 2023-12-29 NOTE — Addendum Note (Signed)
 Addended by: Shandee Jergens W on: 12/29/2023 02:45 PM   Modules accepted: Orders

## 2024-01-08 ENCOUNTER — Other Ambulatory Visit: Payer: Self-pay | Admitting: Cardiology

## 2024-02-04 ENCOUNTER — Other Ambulatory Visit: Payer: Self-pay | Admitting: Cardiology

## 2024-05-12 ENCOUNTER — Other Ambulatory Visit: Payer: Self-pay | Admitting: Cardiology

## 2024-05-12 DIAGNOSIS — E7800 Pure hypercholesterolemia, unspecified: Secondary | ICD-10-CM

## 2024-07-07 ENCOUNTER — Other Ambulatory Visit: Payer: Self-pay | Admitting: Cardiology

## 2024-07-07 DIAGNOSIS — I2510 Atherosclerotic heart disease of native coronary artery without angina pectoris: Secondary | ICD-10-CM

## 2024-07-07 NOTE — Progress Notes (Signed)
 "    HPI: FU coronary artery disease. Carotid Dopplers October 2019 showed no hemodynamically significant stenosis. Patient had PCI of circumflex with drug-eluting stent 2/21. Lipitor changed to Crestor at previous office visit due to myalgias. Echocardiogram December 2023 showed normal LV function, grade 1 diastolic dysfunction, trace aortic insufficiency. Cardiac catheterization July 2024 showed 60% second diagonal, occluded marginal, 50% RCA and nonstenotic proximal circumflex to mid circumflex lesion previously treated.  LV function normal as was left ventricular end-diastolic pressure.  CTA April 2024 showed no acute large vessel occlusion or significant stenosis; coarse calcification noted but less than 50% bilaterally.  Since last seen, patient denies dyspnea or syncope.  Occasional chest discomfort but has not taken nitroglycerin  in greater than 1 month.  Symptoms are similar to previous.  Current Outpatient Medications  Medication Sig Dispense Refill   amLODipine  (NORVASC ) 2.5 MG tablet Take 1 tablet (2.5 mg total) by mouth daily. 90 tablet 2   aspirin  EC 81 MG tablet Take 81 mg by mouth in the morning.     Continuous Glucose Sensor (FREESTYLE LIBRE 3 SENSOR) MISC change sensor topically every 14 days to monitor blood glucose continuously; Duration: 28 days     diphenhydramine-acetaminophen  (TYLENOL  PM) 25-500 MG TABS tablet Take 1 tablet by mouth at bedtime.     doxepin (SINEQUAN) 10 MG capsule Take 20 mg by mouth daily.     ezetimibe  (ZETIA ) 10 MG tablet Take 1 tablet (10 mg total) by mouth daily. 90 tablet 3   finasteride (PROSCAR) 5 MG tablet Take 5 mg by mouth every evening.     fluticasone (FLONASE) 50 MCG/ACT nasal spray Place 1-2 sprays into both nostrils every evening. (Patient taking differently: Place 1-2 sprays into both nostrils as needed.)     insulin  lispro (HUMALOG ) 100 UNIT/ML injection Inject 5-16 Units into the skin as directed. 5-12 units in the morning 12-16 units in the  evening     insulin  NPH Human (NOVOLIN N) 100 UNIT/ML injection Inject 10-14 Units into the skin in the morning and at bedtime. Mix with Humalog      Insulin  Syringe-Needle U-100 (B-D INS SYR ULTRAFINE 1CC/30G) 30G X 1/2 1 ML MISC Uses 2 needles per day 30 days; Duration: 50     ketoconazole (NIZORAL) 2 % shampoo Apply 1 Application topically 3 (three) times a week.     metoprolol  succinate (TOPROL -XL) 25 MG 24 hr tablet Take 1 tablet (25 mg total) by mouth daily. 90 tablet 1   mupirocin  ointment (BACTROBAN ) 2 % Apply 1 application topically daily as needed (Skin irritation).      pantoprazole (PROTONIX) 40 MG tablet Take 40 mg by mouth every evening.     polyethylene glycol powder (GLYCOLAX/MIRALAX) 17 GM/SCOOP powder Take 17 g by mouth daily as needed (regularity).     pravastatin  (PRAVACHOL ) 40 MG tablet Take 1 tablet (40 mg total) by mouth every evening. 90 tablet 2   pregabalin (LYRICA) 100 MG capsule Take 100 mg by mouth as needed.     SURE COMFORT PEN NEEDLES 32G X 4 MM MISC as directed.     tamsulosin  (FLOMAX ) 0.4 MG CAPS capsule Take 0.4 mg by mouth at bedtime.      Vitamin D, Ergocalciferol, (DRISDOL) 1.25 MG (50000 UNIT) CAPS capsule Take 50,000 Units by mouth every Monday.     zolpidem  (AMBIEN ) 10 MG tablet Take 10 mg by mouth at bedtime.     benzonatate  (TESSALON ) 200 MG capsule Take 200 mg by mouth 2 (two)  times daily as needed for cough. (Patient not taking: Reported on 07/17/2024)     diazepam  (VALIUM ) 2 MG tablet Take 2 mg by mouth daily as needed (dizziness).  (Patient not taking: Reported on 07/17/2024)     loratadine  (CLARITIN ) 10 MG tablet Take 10 mg by mouth in the morning. (Patient not taking: Reported on 07/17/2024)     methocarbamol (ROBAXIN) 500 MG tablet Take 500 mg by mouth 3 (three) times daily as needed for muscle spasms. (Patient not taking: Reported on 07/17/2024)     nitroGLYCERIN  (NITROSTAT ) 0.4 MG SL tablet Place 1 tablet (0.4 mg total) under the tongue every 5 (five)  minutes as needed for chest pain (chest pain). (Patient not taking: Reported on 07/17/2024) 25 tablet 4   OVER THE COUNTER MEDICATION Take 1 capsule by mouth daily. Beet Root Extract Supplement     No current facility-administered medications for this visit.     Past Medical History:  Diagnosis Date   Atherosclerotic coronary vascular disease    SINGLE VESSEL OCCULSIVE   Diabetes mellitus    Hyperlipidemia    Hypertension    IHD (ischemic heart disease)    SOB (shortness of breath) on exertion     Past Surgical History:  Procedure Laterality Date   CARDIAC CATHETERIZATION  08/03/2002   NORMAL LEFT VENTRICULAR SIZE AND EXCELLENT CONTRACTILITY WITH NO SEGMENTAL WALL ABNORMALITIES. EF 70-75%   CARDIAC CATHETERIZATION  08/29/2019   CORONARY STENT INTERVENTION N/A 08/29/2019   Procedure: CORONARY STENT INTERVENTION;  Surgeon: Dann Candyce RAMAN, MD;  Location: [facility] INVASIVE CV LAB;  Service: Cardiovascular;  Laterality: N/A;   CORONARY STENT PLACEMENT  08/29/2019   CORONARY ULTRASOUND/IVUS N/A 08/29/2019   Procedure: Intravascular Ultrasound/IVUS;  Surgeon: Dann Candyce RAMAN, MD;  Location: [facility] INVASIVE CV LAB;  Service: Cardiovascular;  Laterality: N/A;   EYE SURGERY     LEFT HEART CATH AND CORONARY ANGIOGRAPHY N/A 08/29/2019   Procedure: LEFT HEART CATH AND CORONARY ANGIOGRAPHY;  Surgeon: Dann Candyce RAMAN, MD;  Location: [facility] INVASIVE CV LAB;  Service: Cardiovascular;  Laterality: N/A;   LEFT HEART CATH AND CORONARY ANGIOGRAPHY N/A 01/13/2023   Procedure: LEFT HEART CATH AND CORONARY ANGIOGRAPHY;  Surgeon: Jordan, Peter M, MD;  Location: [facility] INVASIVE CV LAB;  Service: Cardiovascular;  Laterality: N/A;    Social History   Socioeconomic History   Marital status: Married    Spouse name: Not on file   Number of children: Not on file   Years of education: Not on file   Highest education level: Not on file  Occupational History   Not on file  Tobacco Use   Smoking status: Never    Smokeless tobacco: Never  Vaping Use   Vaping status: Never Used  Substance and Sexual Activity   Alcohol use: No   Drug use: No   Sexual activity: Not on file  Other Topics Concern   Not on file  Social History Narrative   Not on file   Social Drivers of Health   Tobacco Use: Low Risk (07/17/2024)   Patient History    Smoking Tobacco Use: Never    Smokeless Tobacco Use: Never    Passive Exposure: Not on file  Financial Resource Strain: Low Risk (12/10/2023)   Received from [facility]   Overall Financial Resource Strain (CARDIA)    Difficulty of Paying Living Expenses: Not hard at all  Food Insecurity: No Food Insecurity (12/10/2023)   Received from [facility]    Within  the past 12 months, you worried that your food would run out before you got the money to buy more.: Never true    Within the past 12 months, the food you bought just didn't last and you didn't have money to get more.: Never true  Transportation Needs: No Transportation Needs (12/10/2023)   Received from [facility]   PRAPARE - Transportation    Lack of Transportation (Medical): No    Lack of Transportation (Non-Medical): No  Physical Activity: Not on file  Stress: Not on file  Social Connections: Not on file  Intimate Partner Violence: Not on file  Depression (EYV7-0): Not on file  Alcohol Screen: Not on file  Housing: Low Risk (12/10/2023)   Received from [facility]    In the last 12 months, was there a time when you were not able to pay the mortgage or rent on time?: No    In the past 12 months, how many times have you moved where you were living?: 0    At any time in the past 12 months, were you homeless or living in a shelter (including now)?: No  Utilities: Not At Risk (12/10/2023)   Received from [facility] Utilities    Threatened with loss of utilities: No  Health Literacy: Not on file    Family History  Problem Relation Age of Onset   Arthritis Mother    Heart  disease Father     ROS: no fevers or chills, productive cough, hemoptysis, dysphasia, odynophagia, melena, hematochezia, dysuria, hematuria, rash, seizure activity, orthopnea, PND, pedal edema, claudication. Remaining systems are negative.  Physical Exam: Well-developed well-nourished in no acute distress.  Skin is warm and dry.  HEENT is normal.  Neck is supple.  Chest is clear to auscultation with normal expansion.  Cardiovascular exam is regular rate and rhythm.  2/6 systolic murmur radiating to the carotids. Abdominal exam nontender or distended. No masses palpated. Extremities show no edema. neuro grossly intact  EKG Interpretation Date/Time:  Monday July 17 2024 10:23:13 EST Ventricular Rate:  68 PR Interval:  132 QRS Duration:  90 QT Interval:  388 QTC Calculation: 412 R Axis:   -11  Text Interpretation: Normal sinus rhythm Normal ECG Confirmed by Pietro Rogue ([id number]) on 07/17/2024 10:29:17 AM    A/P  1 chest pain-patient has had difficulties with recurrent atypical chest pain and symptoms are unchanged.  Most recent catheterization revealed nonobstructive coronary artery disease and electrocardiogram shows no diagnostic ST changes.  Will continue medical therapy at this point.  2 coronary artery disease-continue aspirin  and statin.  3 hypertension-patient's blood pressure is controlled.  Continue present medications.  4 hyperlipidemia-previously did not tolerate Lipitor.  Continue Zetia  and pravastatin .  5 carotid artery disease-continue aspirin  and statin.  Less than 50% bilaterally.  6 murmur-murmur sounds louder on exam.  Question aortic stenosis.  Will plan repeat echocardiogram.  Rogue Pietro, MD    "

## 2024-07-17 ENCOUNTER — Encounter: Payer: Self-pay | Admitting: Cardiology

## 2024-07-17 ENCOUNTER — Ambulatory Visit: Admitting: Cardiology

## 2024-07-17 VITALS — BP 129/59 | HR 68 | Ht 65.5 in | Wt 166.4 lb

## 2024-07-17 DIAGNOSIS — I2510 Atherosclerotic heart disease of native coronary artery without angina pectoris: Secondary | ICD-10-CM

## 2024-07-17 DIAGNOSIS — I10 Essential (primary) hypertension: Secondary | ICD-10-CM

## 2024-07-17 DIAGNOSIS — R011 Cardiac murmur, unspecified: Secondary | ICD-10-CM

## 2024-07-17 DIAGNOSIS — E7800 Pure hypercholesterolemia, unspecified: Secondary | ICD-10-CM | POA: Diagnosis not present

## 2024-07-17 NOTE — Patient Instructions (Signed)
° °  Testing/Procedures:  Your physician has requested that you have an echocardiogram. Echocardiography is a painless test that uses sound waves to create images of your heart. It provides your doctor with information about the size and shape of your heart and how well your hearts chambers and valves are working. This procedure takes approximately one hour. There are no restrictions for this procedure. Please do NOT wear cologne, perfume, aftershave, or lotions (deodorant is allowed). Please arrive 15 minutes prior to your appointment time.  Please note: We ask at that you not bring children with you during ultrasound (echo/ vascular) testing. Due to room size and safety concerns, children are not allowed in the ultrasound rooms during exams. Our front office staff cannot provide observation of children in our lobby area while testing is being conducted. An adult accompanying a patient to their appointment will only be allowed in the ultrasound room at the discretion of the ultrasound technician under special circumstances. We apologize for any inconvenience. [facility] 1ST FLOOR IMAGING DEPARTMENT  Follow-Up: At [facility], you and your health needs are our priority.  As part of our continuing mission to provide you with exceptional heart care, our providers are all part of one team.  This team includes your primary Cardiologist (physician) and Advanced Practice Providers or APPs (Physician Assistants and Nurse Practitioners) who all work together to provide you with the care you need, when you need it.  Your next appointment:   6 month(s)  Provider:   Redell Shallow, MD

## 2024-08-03 ENCOUNTER — Ambulatory Visit (HOSPITAL_BASED_OUTPATIENT_CLINIC_OR_DEPARTMENT_OTHER)

## 2024-08-10 ENCOUNTER — Ambulatory Visit (HOSPITAL_BASED_OUTPATIENT_CLINIC_OR_DEPARTMENT_OTHER): Admission: RE | Admit: 2024-08-10 | Discharge: 2024-08-10 | Attending: Cardiology | Admitting: Cardiology

## 2024-08-10 DIAGNOSIS — I2510 Atherosclerotic heart disease of native coronary artery without angina pectoris: Secondary | ICD-10-CM

## 2024-08-10 LAB — ECHOCARDIOGRAM COMPLETE
AR max vel: 1.6 cm2
AV Area VTI: 1.5 cm2
AV Area mean vel: 1.48 cm2
AV Mean grad: 10.4 mmHg
AV Peak grad: 18.1 mmHg
AV Vena cont: 0.3 cm
Ao pk vel: 2.13 m/s
Area-P 1/2: 2.55 cm2
Calc EF: 71.6 %
MV M vel: 4.88 m/s
MV Peak grad: 95.1 mmHg
P 1/2 time: 134 ms
S' Lateral: 2.5 cm
Single Plane A2C EF: 75.2 %
Single Plane A4C EF: 66.7 %

## 2024-08-11 ENCOUNTER — Ambulatory Visit: Payer: Self-pay | Admitting: Cardiology
